# Patient Record
Sex: Female | Born: 1950
Health system: Southern US, Community
[De-identification: ages and names within clinical notes are randomized; demographics above are authoritative.]

## PROBLEM LIST (undated history)

## (undated) DIAGNOSIS — K449 Diaphragmatic hernia without obstruction or gangrene: Secondary | ICD-10-CM

## (undated) DIAGNOSIS — T7840XA Allergy, unspecified, initial encounter: Secondary | ICD-10-CM

## (undated) DIAGNOSIS — IMO0002 Reserved for concepts with insufficient information to code with codable children: Secondary | ICD-10-CM

## (undated) DIAGNOSIS — J45909 Unspecified asthma, uncomplicated: Secondary | ICD-10-CM

## (undated) DIAGNOSIS — D131 Benign neoplasm of stomach: Secondary | ICD-10-CM

## (undated) DIAGNOSIS — K2270 Barrett's esophagus without dysplasia: Secondary | ICD-10-CM

## (undated) DIAGNOSIS — C50919 Malignant neoplasm of unspecified site of unspecified female breast: Secondary | ICD-10-CM

## (undated) DIAGNOSIS — E785 Hyperlipidemia, unspecified: Secondary | ICD-10-CM

## (undated) DIAGNOSIS — K219 Gastro-esophageal reflux disease without esophagitis: Secondary | ICD-10-CM

## (undated) DIAGNOSIS — M1990 Unspecified osteoarthritis, unspecified site: Secondary | ICD-10-CM

## (undated) DIAGNOSIS — I10 Essential (primary) hypertension: Secondary | ICD-10-CM

## (undated) HISTORY — DX: Unspecified osteoarthritis, unspecified site: M19.90

## (undated) HISTORY — DX: Reserved for concepts with insufficient information to code with codable children: IMO0002

## (undated) HISTORY — DX: Barrett's esophagus without dysplasia: K22.70

## (undated) HISTORY — DX: Allergy, unspecified, initial encounter: T78.40XA

## (undated) HISTORY — PX: TOTAL ABDOMINAL HYSTERECTOMY: SHX209

## (undated) HISTORY — DX: Hyperlipidemia, unspecified: E78.5

## (undated) HISTORY — PX: EYE SURGERY: SHX253

## (undated) HISTORY — DX: Essential (primary) hypertension: I10

## (undated) HISTORY — DX: Benign neoplasm of stomach: D13.1

## (undated) HISTORY — PX: MASTECTOMY: SHX3

## (undated) HISTORY — DX: Malignant neoplasm of unspecified site of unspecified female breast: C50.919

## (undated) HISTORY — DX: Gastro-esophageal reflux disease without esophagitis: K21.9

## (undated) HISTORY — PX: COLONOSCOPY: SHX174

## (undated) HISTORY — PX: BREAST RECONSTRUCTION: SHX9

## (undated) HISTORY — DX: Unspecified asthma, uncomplicated: J45.909

## (undated) HISTORY — PX: ENDOMETRIAL BIOPSY: SHX622

## (undated) HISTORY — DX: Diaphragmatic hernia without obstruction or gangrene: K44.9

## (undated) HISTORY — PX: UPPER GASTROINTESTINAL ENDOSCOPY: SHX188

---

## 1998-08-30 ENCOUNTER — Other Ambulatory Visit: Admission: RE | Admit: 1998-08-30 | Discharge: 1998-08-30 | Payer: Self-pay | Admitting: *Deleted

## 1999-10-20 ENCOUNTER — Other Ambulatory Visit: Admission: RE | Admit: 1999-10-20 | Discharge: 1999-10-20 | Payer: Self-pay | Admitting: *Deleted

## 1999-11-01 ENCOUNTER — Ambulatory Visit (HOSPITAL_COMMUNITY): Admission: RE | Admit: 1999-11-01 | Discharge: 1999-11-01 | Payer: Self-pay | Admitting: *Deleted

## 1999-12-21 ENCOUNTER — Encounter: Admission: RE | Admit: 1999-12-21 | Discharge: 1999-12-21 | Payer: Self-pay | Admitting: *Deleted

## 2001-05-28 ENCOUNTER — Other Ambulatory Visit: Admission: RE | Admit: 2001-05-28 | Discharge: 2001-05-28 | Payer: Self-pay | Admitting: *Deleted

## 2001-07-01 ENCOUNTER — Encounter: Payer: Self-pay | Admitting: Internal Medicine

## 2001-07-01 ENCOUNTER — Encounter: Admission: RE | Admit: 2001-07-01 | Discharge: 2001-07-01 | Payer: Self-pay | Admitting: Internal Medicine

## 2001-11-10 ENCOUNTER — Encounter: Admission: RE | Admit: 2001-11-10 | Discharge: 2001-11-10 | Payer: Self-pay | Admitting: Internal Medicine

## 2003-02-22 ENCOUNTER — Ambulatory Visit (HOSPITAL_COMMUNITY): Admission: RE | Admit: 2003-02-22 | Discharge: 2003-02-22 | Payer: Self-pay | Admitting: *Deleted

## 2004-09-27 ENCOUNTER — Ambulatory Visit (HOSPITAL_COMMUNITY): Admission: RE | Admit: 2004-09-27 | Discharge: 2004-09-27 | Payer: Self-pay | Admitting: *Deleted

## 2010-06-05 ENCOUNTER — Other Ambulatory Visit: Payer: Self-pay | Admitting: Internal Medicine

## 2010-06-05 DIAGNOSIS — N63 Unspecified lump in unspecified breast: Secondary | ICD-10-CM

## 2010-06-08 ENCOUNTER — Other Ambulatory Visit: Payer: Self-pay | Admitting: Internal Medicine

## 2010-06-08 ENCOUNTER — Ambulatory Visit
Admission: RE | Admit: 2010-06-08 | Discharge: 2010-06-08 | Disposition: A | Discharge status: Home / Self Care | Payer: 59 | Source: Ambulatory Visit | Attending: Internal Medicine | Admitting: Internal Medicine

## 2010-06-08 ENCOUNTER — Other Ambulatory Visit: Payer: Self-pay | Admitting: Diagnostic Radiology

## 2010-06-08 DIAGNOSIS — N63 Unspecified lump in unspecified breast: Secondary | ICD-10-CM

## 2010-06-09 ENCOUNTER — Other Ambulatory Visit: Payer: Self-pay | Admitting: Internal Medicine

## 2010-06-09 DIAGNOSIS — C50912 Malignant neoplasm of unspecified site of left female breast: Secondary | ICD-10-CM

## 2010-06-13 ENCOUNTER — Ambulatory Visit
Admission: RE | Admit: 2010-06-13 | Discharge: 2010-06-13 | Disposition: A | Discharge status: Home / Self Care | Payer: 59 | Source: Ambulatory Visit | Attending: Internal Medicine | Admitting: Internal Medicine

## 2010-06-13 DIAGNOSIS — C50912 Malignant neoplasm of unspecified site of left female breast: Secondary | ICD-10-CM

## 2010-06-13 MED ORDER — GADOBENATE DIMEGLUMINE 529 MG/ML IV SOLN: 17.0000 mL | Freq: Once | INTRAVENOUS | Status: AC | PRN

## 2010-06-14 ENCOUNTER — Encounter (HOSPITAL_BASED_OUTPATIENT_CLINIC_OR_DEPARTMENT_OTHER): Payer: 59 | Admitting: Oncology

## 2010-06-14 ENCOUNTER — Other Ambulatory Visit: Payer: Self-pay | Admitting: Oncology

## 2010-06-14 DIAGNOSIS — C50519 Malignant neoplasm of lower-outer quadrant of unspecified female breast: Secondary | ICD-10-CM

## 2010-06-14 DIAGNOSIS — C50919 Malignant neoplasm of unspecified site of unspecified female breast: Secondary | ICD-10-CM

## 2010-06-14 LAB — COMPREHENSIVE METABOLIC PANEL
ALT: 23 U/L (ref 0–35)
CO2: 31 mEq/L (ref 19–32)
Calcium: 9.6 mg/dL (ref 8.4–10.5)
Chloride: 99 mEq/L (ref 96–112)
Creatinine, Ser: 0.92 mg/dL (ref 0.40–1.20)
Glucose, Bld: 171 mg/dL — ABNORMAL HIGH (ref 70–99)
Total Protein: 7.2 g/dL (ref 6.0–8.3)

## 2010-06-14 LAB — CBC WITH DIFFERENTIAL/PLATELET
BASO%: 0.5 % (ref 0.0–2.0)
HCT: 41.2 % (ref 34.8–46.6)
MCHC: 33.5 g/dL (ref 31.5–36.0)
MONO#: 0.5 10*3/uL (ref 0.1–0.9)
RBC: 4.76 10*6/uL (ref 3.70–5.45)
WBC: 8 10*3/uL (ref 3.9–10.3)
lymph#: 2.6 10*3/uL (ref 0.9–3.3)

## 2010-06-14 LAB — CANCER ANTIGEN 27.29: CA 27.29: 26 U/mL (ref 0–39)

## 2010-06-19 ENCOUNTER — Other Ambulatory Visit (HOSPITAL_COMMUNITY): Payer: Self-pay | Admitting: General Surgery

## 2010-06-19 ENCOUNTER — Encounter: Payer: 59 | Admitting: Genetic Counselor

## 2010-06-19 DIAGNOSIS — C50912 Malignant neoplasm of unspecified site of left female breast: Secondary | ICD-10-CM

## 2010-06-22 ENCOUNTER — Encounter (HOSPITAL_COMMUNITY): Payer: Self-pay

## 2010-06-22 ENCOUNTER — Encounter (HOSPITAL_COMMUNITY)
Admission: RE | Admit: 2010-06-22 | Discharge: 2010-06-22 | Disposition: A | Discharge status: Home / Self Care | Payer: 59 | Source: Ambulatory Visit | Attending: Oncology | Admitting: Oncology

## 2010-06-22 DIAGNOSIS — C50919 Malignant neoplasm of unspecified site of unspecified female breast: Secondary | ICD-10-CM | POA: Insufficient documentation

## 2010-06-22 LAB — GLUCOSE, CAPILLARY: Glucose-Capillary: 140 mg/dL — ABNORMAL HIGH (ref 70–99)

## 2010-06-22 MED ORDER — FLUDEOXYGLUCOSE F - 18 (FDG) INJECTION
18.4000 | Freq: Once | INTRAVENOUS | Status: AC | PRN
  Administered 2010-06-22: 18.4 via INTRAVENOUS

## 2010-07-24 ENCOUNTER — Other Ambulatory Visit: Payer: Self-pay | Admitting: Oncology

## 2010-07-24 ENCOUNTER — Encounter (HOSPITAL_BASED_OUTPATIENT_CLINIC_OR_DEPARTMENT_OTHER): Payer: 59 | Admitting: Oncology

## 2010-07-24 DIAGNOSIS — C50519 Malignant neoplasm of lower-outer quadrant of unspecified female breast: Secondary | ICD-10-CM

## 2010-07-24 DIAGNOSIS — C50919 Malignant neoplasm of unspecified site of unspecified female breast: Secondary | ICD-10-CM

## 2010-07-24 LAB — VITAMIN D 25 HYDROXY (VIT D DEFICIENCY, FRACTURES): Vit D, 25-Hydroxy: 24 ng/mL — ABNORMAL LOW (ref 30–89)

## 2010-07-25 ENCOUNTER — Ambulatory Visit (HOSPITAL_COMMUNITY)
Admission: RE | Admit: 2010-07-25 | Discharge: 2010-07-25 | Disposition: A | Discharge status: Home / Self Care | Payer: 59 | Source: Ambulatory Visit | Attending: General Surgery | Admitting: General Surgery

## 2010-07-25 ENCOUNTER — Encounter (HOSPITAL_COMMUNITY)
Admission: RE | Admit: 2010-07-25 | Discharge: 2010-07-25 | Disposition: A | Discharge status: Home / Self Care | Payer: 59 | Source: Ambulatory Visit | Attending: General Surgery | Admitting: General Surgery

## 2010-07-25 ENCOUNTER — Other Ambulatory Visit (INDEPENDENT_AMBULATORY_CARE_PROVIDER_SITE_OTHER): Payer: Self-pay | Admitting: General Surgery

## 2010-07-25 ENCOUNTER — Other Ambulatory Visit: Payer: Self-pay | Admitting: Obstetrics and Gynecology

## 2010-07-25 DIAGNOSIS — C50919 Malignant neoplasm of unspecified site of unspecified female breast: Secondary | ICD-10-CM | POA: Insufficient documentation

## 2010-07-25 DIAGNOSIS — C50912 Malignant neoplasm of unspecified site of left female breast: Secondary | ICD-10-CM

## 2010-07-25 DIAGNOSIS — Z01818 Encounter for other preprocedural examination: Secondary | ICD-10-CM | POA: Insufficient documentation

## 2010-07-25 DIAGNOSIS — J45909 Unspecified asthma, uncomplicated: Secondary | ICD-10-CM | POA: Insufficient documentation

## 2010-07-25 DIAGNOSIS — Z01812 Encounter for preprocedural laboratory examination: Secondary | ICD-10-CM | POA: Insufficient documentation

## 2010-07-25 DIAGNOSIS — I10 Essential (primary) hypertension: Secondary | ICD-10-CM | POA: Insufficient documentation

## 2010-07-25 LAB — BASIC METABOLIC PANEL
Calcium: 10.6 mg/dL — ABNORMAL HIGH (ref 8.4–10.5)
GFR calc Af Amer: >60 mL/min (ref >60)
GFR calc non Af Amer: >60 mL/min (ref >60)
Glucose, Bld: 102 mg/dL — ABNORMAL HIGH (ref 70–99)
Sodium: 139 mEq/L (ref 135–145)

## 2010-07-25 LAB — SURGICAL PCR SCREEN: MRSA, PCR: NEGATIVE

## 2010-07-25 LAB — CBC
Platelets: 370 10*3/uL (ref 150–400)
RDW: 13.7 % (ref 11.5–15.5)
WBC: 7.3 10*3/uL (ref 4.0–10.5)

## 2010-07-28 ENCOUNTER — Other Ambulatory Visit (INDEPENDENT_AMBULATORY_CARE_PROVIDER_SITE_OTHER): Payer: Self-pay | Admitting: General Surgery

## 2010-07-28 ENCOUNTER — Inpatient Hospital Stay (HOSPITAL_COMMUNITY)
Admission: RE | Admit: 2010-07-28 | Discharge: 2010-07-30 | DRG: 581 | Disposition: A | Discharge status: Home / Self Care | Payer: 59 | Source: Ambulatory Visit | Attending: General Surgery | Admitting: General Surgery

## 2010-07-28 ENCOUNTER — Ambulatory Visit (HOSPITAL_COMMUNITY)
Admission: RE | Admit: 2010-07-28 | Discharge: 2010-07-28 | Disposition: A | Discharge status: Home / Self Care | Payer: 59 | Source: Ambulatory Visit | Attending: General Surgery | Admitting: General Surgery

## 2010-07-28 DIAGNOSIS — Z880 Allergy status to penicillin: Secondary | ICD-10-CM

## 2010-07-28 DIAGNOSIS — C50912 Malignant neoplasm of unspecified site of left female breast: Secondary | ICD-10-CM

## 2010-07-28 DIAGNOSIS — Z853 Personal history of malignant neoplasm of breast: Secondary | ICD-10-CM

## 2010-07-28 DIAGNOSIS — C50919 Malignant neoplasm of unspecified site of unspecified female breast: Principal | ICD-10-CM | POA: Diagnosis present

## 2010-07-28 MED ORDER — TECHNETIUM TC 99M SULFUR COLLOID FILTERED
1.0000 | Freq: Once | INTRAVENOUS | Status: AC | PRN
  Administered 2010-07-28: 1 via INTRADERMAL

## 2010-08-02 NOTE — Discharge Summary (Signed)
  [name], [name]            ACCOUNT NO.:  [id number]  MEDICAL RECORD NO.:  [id number]           PATIENT TYPE:  I  LOCATION:  5157                         FACILITY:  [facility]  PHYSICIAN:  Etter Sjogren, M.D.     DATE OF BIRTH:  07-20-1950  DATE OF ADMISSION:  07/28/2010 DATE OF DISCHARGE:  07/30/2010                              DISCHARGE SUMMARY   FINAL DIAGNOSIS:  Left breast cancer.  PROCEDURES: 1. Left total mastectomy. 2. Left sentinel lymph node mapping. 3. Left breast reconstruction with tissue expander.  HISTORY OF PRESENT ILLNESS:  A 60 year old woman had breast cancer on the right and has had mastectomy and reconstruction with implant and now has breast cancer on the left and presented for reconstruction.  She was planned to have mastectomy as well.  Procedure risks discussed with her and she understood those and wished to proceed.  She did elect at the same type of reconstruction on the right side using tissue expander followed eventually by placement of implant.  She understood the plan and nature of these procedures.  Further details of history and physical, please see chart.  COURSE IN THE HOSPITAL:  On admission, she was taken to Surgery at which time the mastectomy, sentinel lymph node mapping, and reconstruction tissue expander were performed.  She tolerated that procedure well and postoperatively she did well.  She remained afebrile.  Skin flaps looked very good.  No distinct fashion to compromise.  No evidence any hematoma or bleeding.  No evidence of infections, drains were functioning, thin drainage, and she was tolerating a diet on first postoperative day.  On the second postoperative day it was felt that she should be discharged.  DISPOSITION:  She is discharged on regular diet.  No lifting or vigorous activities.  No raise in her arm overhead.  Drains three times and record the amounts and call the office in few days and report the amount of  drainage.  Incentive spirometry 8-10 times a day at home.  No shower and we will see her back in the office in about a week or sooner if the drains develop minimal drainage.  DISCHARGE MEDICATIONS: 1. Dilaudid 2-4 mg p.o. q.4 for pain. 2. Robaxin 500 mg 1 p.o. q.12 p.r.n. muscle spasm. 3. Cleocin 300 mg 1 p.o. q.8 for about 3 days and then twice a day     until the drains have been removed.  CONDITION ON DISCHARGE:  Good.  FINAL DIAGNOSIS:  Breast cancer.     Etter Sjogren, M.D.     DB/MEDQ  D:  07/30/2010  T:  07/30/2010  Job:  [id number]  Electronically Signed by Etter Sjogren M.D. on 08/02/2010 07:50:26 AM

## 2010-08-02 NOTE — Op Note (Signed)
[name], [name]            ACCOUNT NO.:  [id number]  MEDICAL RECORD NO.:  [id number]           PATIENT TYPE:  I  LOCATION:  5157                         FACILITY:  [facility]  PHYSICIAN:  Etter Sjogren, M.D.     DATE OF BIRTH:  09/20/1950  DATE OF PROCEDURE:  07/28/2010 DATE OF DISCHARGE:                              OPERATIVE REPORT   PREOPERATIVE DIAGNOSIS:  Left breast cancer.  POSTOPERATIVE DIAGNOSIS:  Left breast cancer.  PROCEDURE PERFORMED:  Left breast reconstruction with tissue expander.  SURGEON:  Etter Sjogren, MD  ANESTHESIA:  General.  ESTIMATED BLOOD LOSS:  10 mL.  DRAINS:  Two 19-French.  CLINICAL NOTE:  This is a 60 year old woman who has left breast cancer and is undergoing mastectomy.  She did desire reconstruction.  Options were discussed.  She had had a previous right breast cancer and had a reconstruction with tissue expander implant.  She desired that same reconstruction for the left side.  Procedure risks plus complications were discussed with her in great detail.  She understood those risks to include but not limited to bleeding, infection, anesthesia complications, healing problems, scarring, fluid accumulations, loss of sensation, displacement of device, capsular contracture, wrinkles, ripples, disappointment, asymmetry, pulmonary embolism, pneumothorax, and she understood all this and wished to proceed.  DESCRIPTION OF PROCEDURE:  The patient was in the operating room and the mastectomy had been completed.  The dissection was carried deep to the pectoralis major muscle after first irrigating thoroughly with saline antibiotic solution.  The serratus muscle also lifted laterally for a very short distance and care was taken to avoid damage to the underlying chest cavity.  The submuscular space was made and again irrigation with saline irrigation and antibiotic solution, excellent hemostasis was confirmed.  After thoroughly cleaning the gloves,  the expander was prepared.  This was a Mentor 550 mL moderate height expander and 100 mL sterile saline placed using a closed filling system.  The expander was soaked in antibiotic solution for greater than 5 minutes.  A check was again made for hemostasis and noted to be excellent.  The expander was positioned underneath the muscle and 3-0 Vicryl sutures were placed with one of the suture tabs to avoid rotation and the muscle closed over the expander using 3-0 Vicryl interrupted figure-of-eight sutures. Submuscular coverage was achieved.  The 19-French Blakes were then brought through separate stab wounds inferolaterally and secured with 3- 0 Prolene sutures, one of this was placed towards the axilla and then under the superior breast flap and the other under the inferior breast flap.  Again, antibiotic solution was placed and the skin flaps were inspected and noted to be in excellent color and noticed a vascular compromise.  Additional 100 mL sterile saline was placed using a closed filling system, so the total fill on the expander was 200 mL.  The skin closure with 3-0 Monocryl interrupted inverted deep dermal sutures and Dermabond and antibiotic ointment with dry sterile dressing applied around the drains and then dry sterile dressing over the mastectomy site and the chest binder.  She was transferred to the recovery room stable having tolerated the procedure  well.     Etter Sjogren, M.D.     DB/MEDQ  D:  07/28/2010  T:  07/29/2010  Job:  [id number]  Electronically Signed by Etter Sjogren M.D. on 08/02/2010 07:50:19 AM

## 2010-08-04 NOTE — Op Note (Signed)
[name], [name]            ACCOUNT NO.:  [id number]  MEDICAL RECORD NO.:  [id number]           PATIENT TYPE:  I  LOCATION:  5157                         FACILITY:  [facility]  PHYSICIAN:  Ollen Gross. Vernell Morgans, M.D. DATE OF BIRTH:  [birth year]  DATE OF PROCEDURE:  07/28/2010 DATE OF DISCHARGE:                              OPERATIVE REPORT   PREOPERATIVE DIAGNOSIS:  Locally advanced left breast cancer.  POSTOPERATIVE DIAGNOSIS:  Locally advanced left breast cancer.  PROCEDURE:  Left mastectomy with sentinel node biopsy x2 and injection of blue dye.  SURGEON:  Ollen Gross. Vernell Morgans, MD  ASSISTANT:  Velora Heckler, MD  ANESTHESIA:  General endotracheal.  PROCEDURE:  After informed consent was obtained, the patient was brought to the operating room and placed in supine position on the operating room table.  After adequate induction of general anesthesia, the patient's chest, breast and axillary areas were prepped with ChloraPrepand allowed to dry and then draped in usual sterile manner.  Earlier in the day, the patient had undergone injection of 1 mCi of technetium sulfur colloid in the subareolar position on the left breast.  At this point, 2 mL of methylene blue and 3 mL of injectable saline were also injected in the subareolar position of the left breast.  The breast was massaged for several minutes.  We then identified a hot spot in the left axilla and marked it on the skin with a purple marker.  We then mapped out an elliptical incision around the nipple-areolar complex in order to try to minimize how much skin was removed to keep the incision symmetric to the previous incision she had had on the right breast.  The incision was then made along the mapped outlines with a 10 blade knife.  This incision was carried down through the skin and subcutaneous tissue sharply with the electrocautery.  Skin hooks were then used to elevate the skin flaps anteriorly and thin skin flaps were  created using the harmonic scalpel, blade as well as the Bovie electrocautery.  The dissection was carried circumferentially around the incision all the way down to the chest wall.  Laterally, the dissection was carried down to the latissimus muscle again in the plane between the breast tissue and the subcutaneous fat.  Once we had accomplished that, we were able to identify the hot spot in the left axilla from within the mastectomy incision.  Using a combination of blunt tonsil dissection and some sharp dissection with the electrocautery, we were able to identify a hot blue lymph node.  We excised this sharply with the electrocautery.  Ex vivo counts on this node were approximately 2000.  This was sent as sentinel node #1.  We then removed the breast from the chest wall pectoralis muscle with the pectoralis fascia.  This was done sharply with the electrocautery and done from the top down laterally and the breast was removed from the chest wall in a similar fashion.  Once this was accomplished, the left breast was completely removed from the patient. We checked it with the Neoprobe and we did identify hot spot sort of in the tail  of the breast.  We dissected in this area sharply with Metzenbaum scissors and did identify another sentinel node.  Ex vivo counts on this node were around 1000.  This was sent as sentinel node #2.  No other blue or palpable lymph nodes were identified.  There was some radioactivity very high in the axilla but it appeared to be up around the axillary vein and safely tried to go after.  The counts on this were borderline as well.  So at this point the dissection was complete.  The breast was removed, it was marked with a stitch on the lateral aspect and sent to pathology.  Hemostasis was achieved using the Bovie electrocautery.  The wound was irrigated with copious amounts of saline and then packed with moist gauze.  At this point, the patient was doing very well.   The case was then turned over to Dr. Odis Luster for the reconstruction portion.  His portion of the case will be dictated separately.     Ollen Gross. Vernell Morgans, M.D.     PST/MEDQ  D:  07/28/2010  T:  07/29/2010  Job:  [id number]  Electronically Signed by Chevis Pretty III M.D. on 08/04/2010 09:13:21 AM

## 2010-08-21 ENCOUNTER — Other Ambulatory Visit (HOSPITAL_COMMUNITY): Payer: 59

## 2010-08-21 ENCOUNTER — Ambulatory Visit (HOSPITAL_COMMUNITY): Admission: RE | Admit: 2010-08-21 | Payer: 59 | Source: Ambulatory Visit | Admitting: General Surgery

## 2010-08-23 ENCOUNTER — Ambulatory Visit: Payer: 59 | Attending: Gynecologic Oncology | Admitting: Gynecologic Oncology

## 2010-08-23 DIAGNOSIS — C549 Malignant neoplasm of corpus uteri, unspecified: Secondary | ICD-10-CM | POA: Insufficient documentation

## 2010-08-23 DIAGNOSIS — C50419 Malignant neoplasm of upper-outer quadrant of unspecified female breast: Secondary | ICD-10-CM | POA: Insufficient documentation

## 2010-08-23 DIAGNOSIS — Z79899 Other long term (current) drug therapy: Secondary | ICD-10-CM | POA: Insufficient documentation

## 2010-08-23 DIAGNOSIS — K219 Gastro-esophageal reflux disease without esophagitis: Secondary | ICD-10-CM | POA: Insufficient documentation

## 2010-08-23 DIAGNOSIS — Z803 Family history of malignant neoplasm of breast: Secondary | ICD-10-CM | POA: Insufficient documentation

## 2010-08-23 DIAGNOSIS — Z9010 Acquired absence of unspecified breast and nipple: Secondary | ICD-10-CM | POA: Insufficient documentation

## 2010-08-23 DIAGNOSIS — I10 Essential (primary) hypertension: Secondary | ICD-10-CM | POA: Insufficient documentation

## 2010-08-24 NOTE — Consult Note (Signed)
[name], [name]            ACCOUNT NO.:  [id number]  MEDICAL RECORD NO.:  [id number]  LOCATION:  GYN                          FACILITY:  [facility]  PHYSICIAN:  Whitney Marte A. Duard Brady, MD    DATE OF BIRTH:  04/17/1950  DATE OF CONSULTATION:  08/23/2010 DATE OF DISCHARGE:                                CONSULTATION   REFERRING PHYSICIAN:  Kendra H. Tenny Craw, MD  HISTORY OF PRESENT ILLNESS:  [name] is a 60 year old gravida 0 who had a diagnosis of breast cancer in 69 at Whitney age of 37.  At that time, [name] underwent a mastectomy and [name] received postoperative chemotherapy for 6 months, for which [name] developed alopecia.  [name] had not been having regularly scheduled mammograms and [name] last documented mammogram had been in 2003.  [name] saw Dr. Ricki Leonard who referred [name] for mammogram in Whitney left breast in April 2012 and a spiculated 3 cm mass at Whitney 4 o'clock position was appreciated with a 1.5 cm mass at Whitney 1 o'clock position as well as an enlarged 3 cm left axillary node. Biopsies on that day revealed invasive ductal carcinoma, ER positive, PR positive, 30%, proliferative index 80%, HER2 negative.  MRI showed Whitney left upper outer quadrant mass measuring 3 x 2.6 x 3.2 cm with a cluster of Whitney regular spiculation.  [name] also underwent a PET scan that showed uptake in Whitney left breast mass compatible with [name] biopsy-proven invasive ductal carcinoma.  There was no avid metastases in Whitney neck, chest, or abdomen.  However, there was an abnormal uptake in Whitney uterine fundus, endometrial cavity with a differential of endometrial carcinoma, hyperplasia, or polyps.  [name] was referred to Dr. Waynard Leonard for endometrial biopsy, which was done and it revealed a grade 1 endometrioid adenocarcinoma, which is why [name] is ultimately referred to [place] today.  This biopsy was done on May 22.  On May 25th, [name] underwent a left-sided mastectomy with reconstruction.  [name] states that Whitney margins were negative, Whitney  nodes were negative.  [name] is not sure what postoperative therapy [name] will have, [name] will be seeing Dr. Donnie Leonard on June 29th.  [name] states that with regard to menopause, [name] became menopausal at Whitney age of 60 after receiving [name] chemotherapy, but then [name] bleeding started again and [name] has been spotting ever since and is really never stopped having spotting and has been very long-standing, is not associated with any pain or discomfort.  [name] denies any change in bowel or bladder habits.  [name] does occasionally have some constipation, which [name] attributes to Whitney pain medication that [name] is on for [name] mastectomy. [name] is taking Tylenol for this.  [name] otherwise is doing quite well.  PAST SURGICAL HISTORY:  May 25th, left-sided mastectomy and sentinel lymph node by Dr. Janalyn Leonard, history of right mastectomy 61 years ago, eye surgery at age of 60.  MEDICATIONS: 1. Lisinopril. 2. Hydrochlorothiazide. 3. Nexium 20 mg daily.  [name] is not sure of that dose of [name]     antihypertensives.  ALLERGIES:  PENICILLIN, [name] is not sure of Whitney reaction.  PAST MEDICAL HISTORY: 1. Hypertension. 2. Gastroesophageal reflux disease. 3. Barrett esophagitis. 4. Breast cancer. 5.  Endometrial cancer.  SOCIAL HISTORY:  [name] denies use of tobacco or alcohol.  [name] is married. [name] works at [employer].  FAMILY HISTORY:  [name] mother had diabetes, hypertension, and breast cancer at Whitney age of 58.  Maternal aunt had breast cancer at 62 and is now 47.  Three maternal uncles all had lung cancer.  Maternal grandmother passed away in [name] 34s and may have had cancer.  Father passed away from a motor vehicle accident at Whitney age of 19 and was an only child.  Paternal grandmother had breast cancer at 27 and passed away at 58.  [name] did have BRCA1 and BRCA2 testing, which was negative.  PHYSICAL EXAMINATION:  VITAL SIGNS:  Weight 182 pounds, height 5 feet 4.5 inches, a BMI of 32.  Blood pressure 120/70, temperature 98, respirations 18, pulse  76. GENERAL:  A well-nourished, well-developed female in no acute distress. NECK:  Supple.  There is no lymphadenopathy, no thyromegaly. LUNGS:  Clear to auscultation bilaterally. CARDIOVASCULAR:  Regular rate and rhythm.  Left breast surgical site was not inspected. ABDOMEN:  Obese, soft, nontender, nondistended.  There are no palpable masses or hepatosplenomegaly.  Groins are negative for adenopathy. EXTREMITIES:  There is no edema. PELVIC:  External genitalia is within normal limits.  Vagina is slightly atrophic.  Whitney cervix is visualized, it is nulliparous.  There are no visible lesions.  Bimanual examination reveals a narrow arch, corpus of normal size, shape, consistency.  There are no adnexal masses. RECTAL:  Confirms.  ASSESSMENT:  A 60 year old with 2 primaries of breast cancer, most recently was diagnosed with a breast cancer and underwent a left-sided mastectomy on May 25th who also has concomitant grade 1 endometrioid adenocarcinoma.  I discussed with [name] and [name] husband that there are three ways of treating endometrial cancer, One is hormonal manipulation, Whitney other is surgery, Whitney other is radiation.  For [name], I would recommend surgical approach and I think we can accomplish this robotically.  [name] does appear to be recovering fairly well from this cancer.  [name] does have an appointment with Dr. Donnie Leonard on June 26th to follow up in terms of additional procedures were therapy.  [name] would like to proceed with a robotic-assisted hysterectomy.  [name] knows that Whitney uterus will be sent for frozen section and we will determine Whitney need for pelvic and paraortic lymphadenectomy based on Whitney results of Whitney frozen section.  In thinking about [name] BRCA testing, we will proceed also with Whitney removal of Whitney tubes and ovaries, so that if there was any chance of a lesser known BRCA mutation that that would be covered with removal of Whitney tubes and ovaries.  However, with [name] having  an endometrial carcinoma hereditary nonpolyposis colorectal carcinoma picture does arise; however, there was no colon cancers in family and I did not believe [name] most likely require any an additional genetic testing.  Risks and benefits of surgery including bleeding, infection, injury to surrounding organs, lymphedema, and conversion to a laparotomy were discussed with [name] and [name] wished to proceed.  We tentatively have [name] scheduled for surgery on July 10th that [name] knows that that can be altered pending [name] consultation with Dr. Donnie Leonard.  [name] has my card and will contact me if [name] has any questions.     Daryll Spisak A. Duard Brady, MD     PAG/MEDQ  D:  08/23/2010  T:  08/24/2010  Job:  [id number]  cc:   Pierce Crane, M.D., F.R.C.P.C. Fax: [phone]  Freddrick March. Tenny Craw, MD Fax: [phone]  Telford Nab, R.N. [address] [place], [place] [address]  Angeline Slim, MD  Juline Patch, M.D. Fax: [phone]  Electronically Signed by Cleda Mccreedy MD on 08/24/2010 02:51:22 PM

## 2010-09-01 ENCOUNTER — Encounter (HOSPITAL_BASED_OUTPATIENT_CLINIC_OR_DEPARTMENT_OTHER): Payer: 59 | Admitting: Oncology

## 2010-09-01 DIAGNOSIS — C50919 Malignant neoplasm of unspecified site of unspecified female breast: Secondary | ICD-10-CM

## 2010-09-08 ENCOUNTER — Encounter (HOSPITAL_COMMUNITY): Payer: 59

## 2010-09-08 ENCOUNTER — Other Ambulatory Visit: Payer: Self-pay | Admitting: Gynecologic Oncology

## 2010-09-08 LAB — DIFFERENTIAL
Basophils Relative: 0 % (ref 0–1)
Eosinophils Absolute: 0.2 10*3/uL (ref 0.0–0.7)
Monocytes Relative: 7 % (ref 3–12)
Neutrophils Relative %: 61 % (ref 43–77)

## 2010-09-08 LAB — COMPREHENSIVE METABOLIC PANEL
ALT: 34 U/L (ref 0–35)
AST: 22 U/L (ref 0–37)
CO2: 32 mEq/L (ref 19–32)
Calcium: 9.9 mg/dL (ref 8.4–10.5)
Creatinine, Ser: 0.83 mg/dL (ref 0.50–1.10)
GFR calc non Af Amer: >60 mL/min (ref >60)
Sodium: 139 mEq/L (ref 135–145)
Total Protein: 8 g/dL (ref 6.0–8.3)

## 2010-09-08 LAB — CBC
MCH: 28.1 pg (ref 26.0–34.0)
Platelets: 358 10*3/uL (ref 150–400)
RBC: 4.62 MIL/uL (ref 3.87–5.11)
RDW: 13.6 % (ref 11.5–15.5)

## 2010-09-08 LAB — SURGICAL PCR SCREEN
MRSA, PCR: NEGATIVE
Staphylococcus aureus: NEGATIVE

## 2010-09-12 ENCOUNTER — Ambulatory Visit (HOSPITAL_COMMUNITY)
Admission: RE | Admit: 2010-09-12 | Discharge: 2010-09-14 | Disposition: A | Discharge status: Home / Self Care | Payer: 59 | Source: Ambulatory Visit | Attending: Obstetrics & Gynecology | Admitting: Obstetrics & Gynecology

## 2010-09-12 ENCOUNTER — Other Ambulatory Visit: Payer: Self-pay | Admitting: Gynecologic Oncology

## 2010-09-12 DIAGNOSIS — Z79899 Other long term (current) drug therapy: Secondary | ICD-10-CM | POA: Insufficient documentation

## 2010-09-12 DIAGNOSIS — C55 Malignant neoplasm of uterus, part unspecified: Secondary | ICD-10-CM | POA: Insufficient documentation

## 2010-09-12 DIAGNOSIS — Z01812 Encounter for preprocedural laboratory examination: Secondary | ICD-10-CM | POA: Insufficient documentation

## 2010-09-12 DIAGNOSIS — K219 Gastro-esophageal reflux disease without esophagitis: Secondary | ICD-10-CM | POA: Insufficient documentation

## 2010-09-12 DIAGNOSIS — I10 Essential (primary) hypertension: Secondary | ICD-10-CM | POA: Insufficient documentation

## 2010-09-12 DIAGNOSIS — J45909 Unspecified asthma, uncomplicated: Secondary | ICD-10-CM | POA: Insufficient documentation

## 2010-09-12 DIAGNOSIS — Z853 Personal history of malignant neoplasm of breast: Secondary | ICD-10-CM | POA: Insufficient documentation

## 2010-09-12 LAB — TYPE AND SCREEN
ABO/RH(D): A POS
Antibody Screen: NEGATIVE

## 2010-09-12 LAB — SAMPLE TO BLOOD BANK

## 2010-09-12 LAB — ABO/RH: ABO/RH(D): A POS

## 2010-09-13 LAB — CBC
HCT: 37 % (ref 36.0–46.0)
MCHC: 31.9 g/dL (ref 30.0–36.0)
MCV: 86.4 fL (ref 78.0–100.0)
Platelets: 332 10*3/uL (ref 150–400)
RDW: 13.7 % (ref 11.5–15.5)

## 2010-09-14 ENCOUNTER — Encounter (INDEPENDENT_AMBULATORY_CARE_PROVIDER_SITE_OTHER): Payer: Self-pay | Admitting: General Surgery

## 2010-09-18 NOTE — Op Note (Signed)
[name], [name]            ACCOUNT NO.:  [id number]  MEDICAL RECORD NO.:  [id number]  LOCATION:  DAYL                         FACILITY:  [facility]  PHYSICIAN:  Laxmi Choung A. Duard Brady, MD    DATE OF BIRTH:  [birth year]  DATE OF PROCEDURE:  09/12/2010 DATE OF DISCHARGE:                              OPERATIVE REPORT   PREOPERATIVE DIAGNOSIS:  Grade 1 endometrioid adenocarcinoma.  POSTOPERATIVE DIAGNOSIS:  Grade 1 endometrioid adenocarcinoma.  PROCEDURE:  Total robotic hysterectomy, bilateral salpingo-oophorectomy, right pelvic lymph node dissection.  SURGEONS:  Micah Galeno A. Duard Brady, MD and Roseanna Rainbow, M.D.  ASSISTANT:  Aundria Rud  ANESTHESIA:  General.  ESTIMATED BLOOD LOSS:  Less than 50 mL.  IV FLUIDS:  1000 mL.  URINE OUTPUT:  100 mL.  COMPLICATIONS:  None.  SPECIMENS:  Included cervix, uterus, bilateral tubes and ovaries, right pelvic lymph nodes for pathology.  OPERATIVE FINDINGS:  Included a normal sized uterus.  Markedly redundant rectosigmoid colon with physiologic adhesions to the left pelvic sidewall and left adnexa.  Frozen section with a grade 1 endometrioid adenocarcinoma with adenomyosis.  No definitive depth of myometrial invasion.  Otherwise normal anatomic survey.  DESCRIPTION OF PROCEDURE:  The patient was identified in the preoperative holding area as self.  Informed consent was signed on the chart.  Risks and benefits of the procedure were discussed with the patient, her questions were elicited and answered to her satisfaction and she wished to proceed.  The patient was then taken to the operating room, placed in supine position with her arms tucked at her sides while awake to ensure maximum comfort and safety.  After arms were tucked, general anesthesia was induced and an OG tube was placed to suction. She was then placed in dorsal lithotomy position with appropriate precautions and SCDs.  Time-out was performed to confirm the patient, the  procedure, antibiotic and allergy status.  The abdomen was then prepped in the usual fashion.  After time-out was performed, the perineum and vagina were prepped.  Foley catheter was inserted in the bladder under sterile conditions.  Sterile speculum was placed in the vagina.  The cervix was grasped with single-tooth tenaculum.  It was dilated without difficulty and a ZUMI with a small KOH ring was applied. After the patient's prep was then dried, she was then draped.  Second time-out was performed to confirm the patient, the procedure, antibiotic, allergy status as well as staff.  A 2 cm below the left infracostal margin and midclavicular line, the skin was anesthetized with 0.25% Marcaine.  A 5-mm incision was made with the knife.  Using the 5-mm Optiview, direct intra-abdominal placement was secured.  The abdomen was insufflated with CO2 gas.  At this point and all points during the case, the patient's intra-abdominal pressure did not increase over 50 mmHg.  After insufflation, the patient was then placed in deep Trendelenburg position.  The 24 cm above the pubic symphysis, the area was marked for the robotic camera port.  10 cm and then at 15-degree angle, 8 mm bilateral ports were identified.  Each of the skin area was anesthetized with 0.25% Marcaine and the incisions were made.  Under direct visualization, the 10/12 camera port  was placed in the supraumbilical region.  The 8-mm robotic trocars were placed laterally. The small bowel was then folded on its mesentery.  The robot was then docked.  The round ligament on the patient's right side was transected using monopolar cautery.  The anterior and posterior leaves of the broad ligament were opened.  The pararectal space was opened and the ureter was identified.  A window was made between the IP and the ureter.  The IP was coagulated and transected.  The assistant grasped the colon to remove it from the deep cul-de-sac.  Posteriorly  the uterine vessels were skeletonized.  Anteriorly the bladder flap was carried.  Once the uterine vessels were skeletonized, they were cauterized with bipolar cautery and transected and C-loop was performed.  On the patient's left side, similarly the posterior leaf of broad ligament was opened and the adhesive disease of the rectosigmoid colon to the ovary was taken down. The round ligament was then transected and the bladder flap was completed.  The uterine vessels were skeletonized, coagulated and transected.  The pneumo-occluder balloon was then insufflated and the colpotomy was performed.  The uterus, cervix, tubes and ovaries were delivered through the vagina without difficulty and the pneumo-occluder balloon was replaced.  The uterus was sent for frozen section evaluation.  During this process, the paravesical space on the right side was opened.  The obturator nerve was identified.  Similarly on the patient's left side, the paravesical and pararectal spaces were opened.  The obturator nerve was identified. The nodal bundle extending over the right common iliac on the right side from the level of the bifurcation to the circumflex iliac vein was taken down using sharp dissection.  We then proceeded medially to remove the nodal bundle off the external iliac vein.  The paravesical space was opened with the assistance grasper.  The nodal bundle superior to the obturator nerve was taken without difficulty.  The ureter was noted to be well medially, obturator nerve inferior and genitofemoral nerve was spared.  The nodal bundle was placed in EndoCatch bag and delivered through the assistant 10/12 port.  At this point, frozen section returned as no myometrial invasion, adenomyosis, grade 1 tumor, and therefore the lymphadenectomy was discontinued.  At this point, instruments were changed.  The vaginal cuff was closed using a running suture of 0 Vicryl on a CT-1.  The abdomen and pelvis  were copiously irrigated and noted to be hemostatic.  The robotic trocar instruments were then removed and robot was undocked and the trocars removed from the patient's abdomen.  The CO2 was removed from the patient's abdomen. The 10/12 ports had deep subcu sutures placed with 0 Vicryl on a UR-6.  The skin was closed using 4-0 Vicryl.  Steri-Strips and Benzoin were placed.  The vagina was swabbed after moving the pneumo-occluder balloon and was noted to be hemostatic.  The patient tolerated the procedure well, was taken to recovery room in stable condition.  All instrument, needle, Ray-Tec counts were correct x2.     Miliani Deike A. Duard Brady, MD     PAG/MEDQ  D:  09/12/2010  T:  09/12/2010  Job:  [id number]  cc:   Pierce Crane, M.D., F.R.C.P.C. Fax: [phone]  Freddrick March. Tenny Craw, MD Fax: [phone]  Telford Nab, R.N. [address] [place], [place] [address]  Juline Patch, M.D. Fax: [phone]  Electronically Signed by Cleda Mccreedy MD on 09/18/2010 12:46:48 PM

## 2010-10-02 ENCOUNTER — Encounter (HOSPITAL_BASED_OUTPATIENT_CLINIC_OR_DEPARTMENT_OTHER): Payer: 59 | Admitting: Oncology

## 2010-10-02 DIAGNOSIS — C50919 Malignant neoplasm of unspecified site of unspecified female breast: Secondary | ICD-10-CM

## 2010-10-02 DIAGNOSIS — C50519 Malignant neoplasm of lower-outer quadrant of unspecified female breast: Secondary | ICD-10-CM

## 2010-10-13 ENCOUNTER — Other Ambulatory Visit: Payer: Self-pay | Admitting: Oncology

## 2010-10-13 DIAGNOSIS — C50919 Malignant neoplasm of unspecified site of unspecified female breast: Secondary | ICD-10-CM

## 2010-10-17 ENCOUNTER — Other Ambulatory Visit: Payer: Self-pay | Admitting: Oncology

## 2010-10-17 ENCOUNTER — Encounter (HOSPITAL_BASED_OUTPATIENT_CLINIC_OR_DEPARTMENT_OTHER): Payer: 59 | Admitting: Oncology

## 2010-10-17 DIAGNOSIS — C50219 Malignant neoplasm of upper-inner quadrant of unspecified female breast: Secondary | ICD-10-CM

## 2010-10-17 DIAGNOSIS — C50519 Malignant neoplasm of lower-outer quadrant of unspecified female breast: Secondary | ICD-10-CM

## 2010-10-17 DIAGNOSIS — Z5111 Encounter for antineoplastic chemotherapy: Secondary | ICD-10-CM

## 2010-10-17 DIAGNOSIS — C50919 Malignant neoplasm of unspecified site of unspecified female breast: Secondary | ICD-10-CM

## 2010-10-17 LAB — COMPREHENSIVE METABOLIC PANEL
ALT: 20 U/L (ref 0–35)
AST: 22 U/L (ref 0–37)
Alkaline Phosphatase: 88 U/L (ref 39–117)
CO2: 26 mEq/L (ref 19–32)
Creatinine, Ser: 0.99 mg/dL (ref 0.50–1.10)
Sodium: 139 mEq/L (ref 135–145)
Total Bilirubin: 0.3 mg/dL (ref 0.3–1.2)
Total Protein: 7.4 g/dL (ref 6.0–8.3)

## 2010-10-17 LAB — CBC WITH DIFFERENTIAL/PLATELET
BASO%: 0.6 % (ref 0.0–2.0)
Eosinophils Absolute: 0.2 10*3/uL (ref 0.0–0.5)
LYMPH%: 28.7 % (ref 14.0–49.7)
MCHC: 32.5 g/dL (ref 31.5–36.0)
MCV: 85.6 fL (ref 79.5–101.0)
MONO%: 7.5 % (ref 0.0–14.0)
NEUT#: 4.3 10*3/uL (ref 1.5–6.5)
RBC: 4.71 10*6/uL (ref 3.70–5.45)
RDW: 13.5 % (ref 11.2–14.5)
WBC: 7.1 10*3/uL (ref 3.9–10.3)
nRBC: 0 % (ref 0–0)

## 2010-10-18 ENCOUNTER — Encounter (HOSPITAL_BASED_OUTPATIENT_CLINIC_OR_DEPARTMENT_OTHER): Payer: 59 | Admitting: Oncology

## 2010-10-18 DIAGNOSIS — Z5189 Encounter for other specified aftercare: Secondary | ICD-10-CM

## 2010-10-19 ENCOUNTER — Ambulatory Visit (HOSPITAL_COMMUNITY)
Admission: RE | Admit: 2010-10-19 | Discharge: 2010-10-19 | Disposition: A | Discharge status: Home / Self Care | Payer: 59 | Source: Ambulatory Visit | Attending: Oncology | Admitting: Oncology

## 2010-10-19 ENCOUNTER — Other Ambulatory Visit: Payer: Self-pay | Admitting: Oncology

## 2010-10-19 DIAGNOSIS — C50919 Malignant neoplasm of unspecified site of unspecified female breast: Secondary | ICD-10-CM

## 2010-10-19 LAB — CBC
HCT: 39.6 % (ref 36.0–46.0)
MCH: 27.7 pg (ref 26.0–34.0)
MCHC: 32.6 g/dL (ref 30.0–36.0)
MCV: 85 fL (ref 78.0–100.0)
Platelets: 266 10*3/uL (ref 150–400)
RDW: 13.6 % (ref 11.5–15.5)

## 2010-10-24 ENCOUNTER — Encounter (HOSPITAL_BASED_OUTPATIENT_CLINIC_OR_DEPARTMENT_OTHER): Payer: 59 | Admitting: Oncology

## 2010-10-24 ENCOUNTER — Other Ambulatory Visit: Payer: Self-pay | Admitting: Oncology

## 2010-10-24 DIAGNOSIS — C50519 Malignant neoplasm of lower-outer quadrant of unspecified female breast: Secondary | ICD-10-CM

## 2010-10-24 DIAGNOSIS — C50919 Malignant neoplasm of unspecified site of unspecified female breast: Secondary | ICD-10-CM

## 2010-10-24 LAB — CBC WITH DIFFERENTIAL/PLATELET
BASO%: 0.6 % (ref 0.0–2.0)
Basophils Absolute: 0 10*3/uL (ref 0.0–0.1)
EOS%: 6.3 % (ref 0.0–7.0)
HGB: 12.6 g/dL (ref 11.6–15.9)
MCH: 28.7 pg (ref 25.1–34.0)
MCHC: 34.4 g/dL (ref 31.5–36.0)
MCV: 83.5 fL (ref 79.5–101.0)
MONO%: 7.9 % (ref 0.0–14.0)
RDW: 13.8 % (ref 11.2–14.5)
lymph#: 2.3 10*3/uL (ref 0.9–3.3)

## 2010-10-25 ENCOUNTER — Ambulatory Visit: Payer: 59 | Attending: Gynecologic Oncology | Admitting: Gynecologic Oncology

## 2010-10-25 DIAGNOSIS — Z90710 Acquired absence of both cervix and uterus: Secondary | ICD-10-CM | POA: Insufficient documentation

## 2010-10-25 DIAGNOSIS — C50919 Malignant neoplasm of unspecified site of unspecified female breast: Secondary | ICD-10-CM | POA: Insufficient documentation

## 2010-10-25 DIAGNOSIS — C549 Malignant neoplasm of corpus uteri, unspecified: Secondary | ICD-10-CM | POA: Insufficient documentation

## 2010-10-25 DIAGNOSIS — Z170 Estrogen receptor positive status [ER+]: Secondary | ICD-10-CM | POA: Insufficient documentation

## 2010-10-25 DIAGNOSIS — Z9079 Acquired absence of other genital organ(s): Secondary | ICD-10-CM | POA: Insufficient documentation

## 2010-11-01 NOTE — Consult Note (Signed)
NAMEYUKTHA, KERCHNER            ACCOUNT NO.:  [id number]  MEDICAL RECORD NO.:  [id number]  LOCATION:  GYN                          FACILITY:  [facility]  PHYSICIAN:  Jamilla Galli A. Duard Brady, MD    DATE OF BIRTH:  [birth year]  DATE OF CONSULTATION:  10/25/2010 DATE OF DISCHARGE:                                CONSULTATION   Ms. Tonkinson is a very pleasant 60 year old, gravida 0, who was diagnosed with breast cancer in 55 at the age of 77.  At that time, she underwent mastectomy and received postoperative chemotherapy for 6 months.  She was seen by Dr. Ricki Miller in April 2012, who referred her for a spiculated 3 cm mass as well as enlarged left axillary node.  Biopsies revealed an invasive ductal carcinoma, ER positive, PR positive, HER-2 negative.  MRI showed a mass measuring 3 x 2.6 x 3.2 cm.  PET/CT revealed uptake in the left breast, uterine fundus, and endometrial cavity.  She underwent an endometrial biopsy by Dr. Waynard Reeds, which revealed a grade 1 endometrioid adenocarcinoma.  The patient of note is status post mastectomy with reconstruction on May 25.  We initially saw her on September 12, 2010.  She underwent robotic hysterectomy, BSO, and right pelvic lymph node dissection.  Operative room findings included a normal size uterus, redundant rectosigmoid colon with physiologic adhesions into the left pelvic sidewall and left adnexa.  Frozen section with a grade 1 tumor with adenomyosis with no definitive myometrial involvement.  Pathology was consistent with a grade 1 tumor, with superficial myometrial invasion of 0.2 cm and the myometrium was 1.4 cm in thickness.  The tumor size was 2.2 cm.  There was no cervical stromal involvement.  There was no lymphovascular space involvement.  Zero out of six lymph nodes were involved.  She comes in today for her postoperative check.  She is overall doing quite well from a surgical standpoint.  She did have some spotting this week and she thinks  she might have been too active.  She did get a Port-A-Cath last week and a cycle of chemotherapy.  She has questions about going back to work.  She previously had a fairly sedentary desk job, but I guess over the time of her absent, the job has become much more physical with some heavy lifting.  She does not exactly know what the job involves, so it is difficult to ascertain whether she is able to go back with some restrictions or no restrictions at this time.  PHYSICAL EXAMINATION:  VITAL SIGNS:  Weight 184 pounds, blood pressure 118/78, temperature 98, respirations 16. GENERAL:  Well-nourished, well-developed female, in no acute distress. ABDOMEN:  Shows well-healed surgical incisions.  Abdomen is soft and minimally tender to deep palpation in the mid lower quadrant. PELVIC:  External genitalia are within normal limits.  The vagina is markedly atrophic.  The vaginal cuff is visualized.  There is a small area of eschar measuring approximately 3 to 4 mm.  Suture line is otherwise intact.  Sutures are still visible.  Bimanual examination reveals no cuff tenderness, no masses.  There are postoperative changes.  ASSESSMENT: 39. A 60 year old with a history of breast cancer, who also has  a stage     IA grade 1 endometrioid adenocarcinoma.  With regard to     endometrioid cancer, she does not require any additional treatment.     She does not need to close follow up.  She will return to see [place] in     6 months. 2. With regard to her breast cancer, she will continue her followup     with Dr. Donnie Coffin with recommendations for chemotherapy.  She is not     sure what the next step will be after her chemotherapy whether or     not she will need any additional treatment. 3. With return to work, she will contact me and see Telford Nab next     week with information regarding her new job     description.  If the patient does have a sedentary desk job, I have     no opposition to her returning to  work from my perspective.  She     has not discussed this with Dr. Donnie Coffin.  If there is heavy lifting     involved, we need to consider not just the hysterectomy and the     abdominal incisions but the Port-A-Cath surgery as well.     Sreeja Spies A. Duard Brady, MD     PAG/MEDQ  D:  10/25/2010  T:  10/26/2010  Job:  [id number]  cc:   Pierce Crane, M.D., F.R.C.P.C. Fax: [phone]  Telford Nab, R.N. [address] [place], [place] [address]  Juline Patch, M.D. Fax: [phone]  Freddrick March. Tenny Craw, MD Fax: [phone]  Electronically Signed by Cleda Mccreedy MD on 11/01/2010 01:44:24 PM

## 2010-11-08 ENCOUNTER — Encounter (HOSPITAL_BASED_OUTPATIENT_CLINIC_OR_DEPARTMENT_OTHER): Payer: 59 | Admitting: Oncology

## 2010-11-08 ENCOUNTER — Other Ambulatory Visit: Payer: Self-pay | Admitting: Oncology

## 2010-11-08 DIAGNOSIS — C50219 Malignant neoplasm of upper-inner quadrant of unspecified female breast: Secondary | ICD-10-CM

## 2010-11-08 DIAGNOSIS — Z170 Estrogen receptor positive status [ER+]: Secondary | ICD-10-CM

## 2010-11-08 DIAGNOSIS — C50519 Malignant neoplasm of lower-outer quadrant of unspecified female breast: Secondary | ICD-10-CM

## 2010-11-08 DIAGNOSIS — Z5111 Encounter for antineoplastic chemotherapy: Secondary | ICD-10-CM

## 2010-11-08 DIAGNOSIS — C50919 Malignant neoplasm of unspecified site of unspecified female breast: Secondary | ICD-10-CM

## 2010-11-08 LAB — CBC WITH DIFFERENTIAL/PLATELET
EOS%: 0.9 % (ref 0.0–7.0)
Eosinophils Absolute: 0.1 10*3/uL (ref 0.0–0.5)
HGB: 11.8 g/dL (ref 11.6–15.9)
MCH: 27.7 pg (ref 25.1–34.0)
MCV: 82.6 fL (ref 79.5–101.0)
MONO%: 9.4 % (ref 0.0–14.0)
NEUT#: 4.6 10*3/uL (ref 1.5–6.5)
RBC: 4.26 10*6/uL (ref 3.70–5.45)
RDW: 14.6 % — ABNORMAL HIGH (ref 11.2–14.5)
lymph#: 2 10*3/uL (ref 0.9–3.3)
nRBC: 0 % (ref 0–0)

## 2010-11-08 LAB — BASIC METABOLIC PANEL
Calcium: 9.7 mg/dL (ref 8.4–10.5)
Potassium: 3.7 mEq/L (ref 3.5–5.3)
Sodium: 139 mEq/L (ref 135–145)

## 2010-11-09 ENCOUNTER — Encounter (HOSPITAL_BASED_OUTPATIENT_CLINIC_OR_DEPARTMENT_OTHER): Payer: 59 | Admitting: Oncology

## 2010-11-09 DIAGNOSIS — C50219 Malignant neoplasm of upper-inner quadrant of unspecified female breast: Secondary | ICD-10-CM

## 2010-11-09 DIAGNOSIS — C50519 Malignant neoplasm of lower-outer quadrant of unspecified female breast: Secondary | ICD-10-CM

## 2010-11-09 DIAGNOSIS — Z5189 Encounter for other specified aftercare: Secondary | ICD-10-CM

## 2010-11-15 ENCOUNTER — Encounter (HOSPITAL_BASED_OUTPATIENT_CLINIC_OR_DEPARTMENT_OTHER): Payer: 59 | Admitting: Oncology

## 2010-11-15 ENCOUNTER — Other Ambulatory Visit: Payer: Self-pay | Admitting: Physician Assistant

## 2010-11-15 DIAGNOSIS — C50519 Malignant neoplasm of lower-outer quadrant of unspecified female breast: Secondary | ICD-10-CM

## 2010-11-15 DIAGNOSIS — C50919 Malignant neoplasm of unspecified site of unspecified female breast: Secondary | ICD-10-CM

## 2010-11-15 LAB — CBC WITH DIFFERENTIAL/PLATELET
Basophils Absolute: 0.1 10*3/uL (ref 0.0–0.1)
Eosinophils Absolute: 0.4 10*3/uL (ref 0.0–0.5)
HCT: 33.1 % — ABNORMAL LOW (ref 34.8–46.6)
HGB: 11.2 g/dL — ABNORMAL LOW (ref 11.6–15.9)
LYMPH%: 52.1 % — ABNORMAL HIGH (ref 14.0–49.7)
MONO#: 0.4 10*3/uL (ref 0.1–0.9)
NEUT%: 17.3 % — ABNORMAL LOW (ref 38.4–76.8)
Platelets: 249 10*3/uL (ref 145–400)
WBC: 3.1 10*3/uL — ABNORMAL LOW (ref 3.9–10.3)
lymph#: 1.6 10*3/uL (ref 0.9–3.3)

## 2010-11-28 ENCOUNTER — Encounter (HOSPITAL_BASED_OUTPATIENT_CLINIC_OR_DEPARTMENT_OTHER): Payer: 59 | Admitting: Oncology

## 2010-11-28 ENCOUNTER — Other Ambulatory Visit: Payer: Self-pay | Admitting: Physician Assistant

## 2010-11-28 DIAGNOSIS — C50519 Malignant neoplasm of lower-outer quadrant of unspecified female breast: Secondary | ICD-10-CM

## 2010-11-28 DIAGNOSIS — Z5189 Encounter for other specified aftercare: Secondary | ICD-10-CM

## 2010-11-28 DIAGNOSIS — Z5111 Encounter for antineoplastic chemotherapy: Secondary | ICD-10-CM

## 2010-11-28 DIAGNOSIS — C50919 Malignant neoplasm of unspecified site of unspecified female breast: Secondary | ICD-10-CM

## 2010-11-28 DIAGNOSIS — C50219 Malignant neoplasm of upper-inner quadrant of unspecified female breast: Secondary | ICD-10-CM

## 2010-11-28 LAB — CBC WITH DIFFERENTIAL/PLATELET
Basophils Absolute: 0.1 10*3/uL (ref 0.0–0.1)
Eosinophils Absolute: 0.1 10*3/uL (ref 0.0–0.5)
HCT: 35.5 % (ref 34.8–46.6)
HGB: 12 g/dL (ref 11.6–15.9)
MCV: 82.6 fL (ref 79.5–101.0)
MONO%: 6.9 % (ref 0.0–14.0)
NEUT#: 4.5 10*3/uL (ref 1.5–6.5)
NEUT%: 65.3 % (ref 38.4–76.8)
Platelets: 295 10*3/uL (ref 145–400)
RDW: 15.5 % — ABNORMAL HIGH (ref 11.2–14.5)

## 2010-11-28 LAB — COMPREHENSIVE METABOLIC PANEL
Albumin: 4.3 g/dL (ref 3.5–5.2)
BUN: 18 mg/dL (ref 6–23)
CO2: 28 mEq/L (ref 19–32)
Glucose, Bld: 122 mg/dL — ABNORMAL HIGH (ref 70–99)
Sodium: 136 mEq/L (ref 135–145)
Total Bilirubin: 0.4 mg/dL (ref 0.3–1.2)
Total Protein: 6.9 g/dL (ref 6.0–8.3)

## 2010-11-29 ENCOUNTER — Encounter (HOSPITAL_BASED_OUTPATIENT_CLINIC_OR_DEPARTMENT_OTHER): Payer: 59 | Admitting: Oncology

## 2010-11-29 DIAGNOSIS — C50219 Malignant neoplasm of upper-inner quadrant of unspecified female breast: Secondary | ICD-10-CM

## 2010-11-29 DIAGNOSIS — Z5189 Encounter for other specified aftercare: Secondary | ICD-10-CM

## 2010-11-29 DIAGNOSIS — C50519 Malignant neoplasm of lower-outer quadrant of unspecified female breast: Secondary | ICD-10-CM

## 2010-12-05 ENCOUNTER — Other Ambulatory Visit: Payer: Self-pay | Admitting: Physician Assistant

## 2010-12-05 ENCOUNTER — Encounter (HOSPITAL_BASED_OUTPATIENT_CLINIC_OR_DEPARTMENT_OTHER): Payer: 59 | Admitting: Oncology

## 2010-12-05 DIAGNOSIS — C50519 Malignant neoplasm of lower-outer quadrant of unspecified female breast: Secondary | ICD-10-CM

## 2010-12-05 DIAGNOSIS — C50919 Malignant neoplasm of unspecified site of unspecified female breast: Secondary | ICD-10-CM

## 2010-12-05 DIAGNOSIS — Z5189 Encounter for other specified aftercare: Secondary | ICD-10-CM

## 2010-12-05 DIAGNOSIS — Z5111 Encounter for antineoplastic chemotherapy: Secondary | ICD-10-CM

## 2010-12-05 LAB — CBC WITH DIFFERENTIAL/PLATELET
Eosinophils Absolute: 0.1 10*3/uL (ref 0.0–0.5)
MONO#: 1.1 10*3/uL — ABNORMAL HIGH (ref 0.1–0.9)
NEUT#: 0.6 10*3/uL — ABNORMAL LOW (ref 1.5–6.5)
Platelets: 234 10*3/uL (ref 145–400)
RBC: 3.83 10*6/uL (ref 3.70–5.45)
RDW: 15.6 % — ABNORMAL HIGH (ref 11.2–14.5)
WBC: 3.1 10*3/uL — ABNORMAL LOW (ref 3.9–10.3)
nRBC: 1 % — ABNORMAL HIGH (ref 0–0)

## 2010-12-19 ENCOUNTER — Other Ambulatory Visit: Payer: Self-pay | Admitting: Physician Assistant

## 2010-12-19 ENCOUNTER — Encounter (HOSPITAL_BASED_OUTPATIENT_CLINIC_OR_DEPARTMENT_OTHER): Payer: 59 | Admitting: Oncology

## 2010-12-19 DIAGNOSIS — C50519 Malignant neoplasm of lower-outer quadrant of unspecified female breast: Secondary | ICD-10-CM

## 2010-12-19 DIAGNOSIS — C50919 Malignant neoplasm of unspecified site of unspecified female breast: Secondary | ICD-10-CM

## 2010-12-19 DIAGNOSIS — Z5111 Encounter for antineoplastic chemotherapy: Secondary | ICD-10-CM

## 2010-12-19 DIAGNOSIS — C50219 Malignant neoplasm of upper-inner quadrant of unspecified female breast: Secondary | ICD-10-CM

## 2010-12-19 LAB — COMPREHENSIVE METABOLIC PANEL
AST: 12 U/L (ref 0–37)
Albumin: 4.2 g/dL (ref 3.5–5.2)
BUN: 22 mg/dL (ref 6–23)
Calcium: 9.6 mg/dL (ref 8.4–10.5)
Chloride: 102 mEq/L (ref 96–112)
Glucose, Bld: 139 mg/dL — ABNORMAL HIGH (ref 70–99)
Potassium: 4 mEq/L (ref 3.5–5.3)

## 2010-12-19 LAB — CBC WITH DIFFERENTIAL/PLATELET
Basophils Absolute: 0.1 10*3/uL (ref 0.0–0.1)
HCT: 31.6 % — ABNORMAL LOW (ref 34.8–46.6)
HGB: 10.5 g/dL — ABNORMAL LOW (ref 11.6–15.9)
MONO#: 0.5 10*3/uL (ref 0.1–0.9)
NEUT%: 65 % (ref 38.4–76.8)
WBC: 6.2 10*3/uL (ref 3.9–10.3)
lymph#: 1.5 10*3/uL (ref 0.9–3.3)

## 2010-12-26 ENCOUNTER — Other Ambulatory Visit: Payer: Self-pay | Admitting: Oncology

## 2010-12-26 ENCOUNTER — Other Ambulatory Visit: Payer: Self-pay | Admitting: Physician Assistant

## 2010-12-26 ENCOUNTER — Encounter (HOSPITAL_BASED_OUTPATIENT_CLINIC_OR_DEPARTMENT_OTHER): Payer: 59 | Admitting: Oncology

## 2010-12-26 DIAGNOSIS — C50919 Malignant neoplasm of unspecified site of unspecified female breast: Secondary | ICD-10-CM

## 2010-12-26 DIAGNOSIS — Z9010 Acquired absence of unspecified breast and nipple: Secondary | ICD-10-CM

## 2010-12-26 DIAGNOSIS — Z8542 Personal history of malignant neoplasm of other parts of uterus: Secondary | ICD-10-CM

## 2010-12-26 DIAGNOSIS — Z79899 Other long term (current) drug therapy: Secondary | ICD-10-CM

## 2010-12-26 DIAGNOSIS — Z853 Personal history of malignant neoplasm of breast: Secondary | ICD-10-CM

## 2010-12-26 LAB — CBC WITH DIFFERENTIAL/PLATELET
BASO%: 5.6 % — ABNORMAL HIGH (ref 0.0–2.0)
Basophils Absolute: 0.1 10*3/uL (ref 0.0–0.1)
EOS%: 1.4 % (ref 0.0–7.0)
HGB: 10.5 g/dL — ABNORMAL LOW (ref 11.6–15.9)
MCH: 28.3 pg (ref 25.1–34.0)
NEUT#: 0.5 10*3/uL — ABNORMAL LOW (ref 1.5–6.5)
RBC: 3.71 10*6/uL (ref 3.70–5.45)
RDW: 16.4 % — ABNORMAL HIGH (ref 11.2–14.5)
lymph#: 0.7 10*3/uL — ABNORMAL LOW (ref 0.9–3.3)
nRBC: 0 % (ref 0–0)

## 2011-01-01 ENCOUNTER — Encounter (HOSPITAL_BASED_OUTPATIENT_CLINIC_OR_DEPARTMENT_OTHER): Payer: 59 | Admitting: Oncology

## 2011-01-01 ENCOUNTER — Other Ambulatory Visit: Payer: Self-pay | Admitting: Physician Assistant

## 2011-01-01 DIAGNOSIS — C50519 Malignant neoplasm of lower-outer quadrant of unspecified female breast: Secondary | ICD-10-CM

## 2011-01-01 DIAGNOSIS — Z5189 Encounter for other specified aftercare: Secondary | ICD-10-CM

## 2011-01-01 DIAGNOSIS — Z5111 Encounter for antineoplastic chemotherapy: Secondary | ICD-10-CM

## 2011-01-01 DIAGNOSIS — C50919 Malignant neoplasm of unspecified site of unspecified female breast: Secondary | ICD-10-CM

## 2011-01-01 LAB — CBC WITH DIFFERENTIAL/PLATELET
BASO%: 3.4 % — ABNORMAL HIGH (ref 0.0–2.0)
Eosinophils Absolute: 0.1 10*3/uL (ref 0.0–0.5)
MONO#: 0.9 10*3/uL (ref 0.1–0.9)
NEUT#: 0.3 10*3/uL — CL (ref 1.5–6.5)
Platelets: 298 10*3/uL (ref 145–400)
RBC: 3.66 10*6/uL — ABNORMAL LOW (ref 3.70–5.45)
RDW: 16.3 % — ABNORMAL HIGH (ref 11.2–14.5)
WBC: 2.4 10*3/uL — ABNORMAL LOW (ref 3.9–10.3)
lymph#: 1 10*3/uL (ref 0.9–3.3)
nRBC: 0 % (ref 0–0)

## 2011-01-02 ENCOUNTER — Ambulatory Visit
Admission: RE | Admit: 2011-01-02 | Discharge: 2011-01-02 | Disposition: A | Discharge status: Home / Self Care | Payer: 59 | Source: Ambulatory Visit | Attending: Oncology | Admitting: Oncology

## 2011-01-02 DIAGNOSIS — Z853 Personal history of malignant neoplasm of breast: Secondary | ICD-10-CM

## 2011-01-10 ENCOUNTER — Other Ambulatory Visit: Payer: Self-pay | Admitting: Physician Assistant

## 2011-01-10 ENCOUNTER — Other Ambulatory Visit (HOSPITAL_BASED_OUTPATIENT_CLINIC_OR_DEPARTMENT_OTHER): Payer: 59 | Admitting: Lab

## 2011-01-10 DIAGNOSIS — Z5189 Encounter for other specified aftercare: Secondary | ICD-10-CM

## 2011-01-10 DIAGNOSIS — Z5111 Encounter for antineoplastic chemotherapy: Secondary | ICD-10-CM

## 2011-01-10 DIAGNOSIS — C50919 Malignant neoplasm of unspecified site of unspecified female breast: Secondary | ICD-10-CM

## 2011-01-10 DIAGNOSIS — C50519 Malignant neoplasm of lower-outer quadrant of unspecified female breast: Secondary | ICD-10-CM

## 2011-01-10 LAB — CBC WITH DIFFERENTIAL/PLATELET
Basophils Absolute: 0.1 10*3/uL (ref 0.0–0.1)
HCT: 32.4 % — ABNORMAL LOW (ref 34.8–46.6)
HGB: 10.8 g/dL — ABNORMAL LOW (ref 11.6–15.9)
MONO#: 0.6 10*3/uL (ref 0.1–0.9)
NEUT#: 4.9 10*3/uL (ref 1.5–6.5)
NEUT%: 67 % (ref 38.4–76.8)
RDW: 16.6 % — ABNORMAL HIGH (ref 11.2–14.5)
WBC: 7.3 10*3/uL (ref 3.9–10.3)
lymph#: 1.7 10*3/uL (ref 0.9–3.3)

## 2011-01-15 ENCOUNTER — Telehealth: Payer: Self-pay | Admitting: *Deleted

## 2011-01-15 NOTE — Telephone Encounter (Signed)
Pt called and stated she would like results from her bone scan.  Will review with MD

## 2011-01-21 NOTE — Telephone Encounter (Signed)
Angie If results are normal or do not show definitive evidence of cancer, ls tell pt [name]

## 2011-01-23 ENCOUNTER — Telehealth: Payer: Self-pay | Admitting: *Deleted

## 2011-01-23 NOTE — Telephone Encounter (Signed)
Per MD, attempted to notify pt of "normal" bone scan

## 2011-01-29 ENCOUNTER — Other Ambulatory Visit: Payer: Self-pay | Admitting: *Deleted

## 2011-01-29 ENCOUNTER — Telehealth: Payer: Self-pay | Admitting: *Deleted

## 2011-01-29 ENCOUNTER — Telehealth: Payer: Self-pay | Admitting: Oncology

## 2011-01-29 DIAGNOSIS — C50519 Malignant neoplasm of lower-outer quadrant of unspecified female breast: Secondary | ICD-10-CM

## 2011-01-29 NOTE — Telephone Encounter (Signed)
Notified patient of normal bone scan results per Dr Donnie Coffin.  Pt also informed of 01/10/11 lab results.  Pt stated that she needed pac flushed.  RN advised patient that someone in scheduling will be calling to schedule pac flush.

## 2011-01-29 NOTE — Telephone Encounter (Signed)
called pt and scheduled port flush appt for 11/30

## 2011-02-01 ENCOUNTER — Other Ambulatory Visit: Payer: Self-pay | Admitting: *Deleted

## 2011-02-02 ENCOUNTER — Ambulatory Visit (HOSPITAL_BASED_OUTPATIENT_CLINIC_OR_DEPARTMENT_OTHER): Payer: 59

## 2011-02-02 DIAGNOSIS — C50219 Malignant neoplasm of upper-inner quadrant of unspecified female breast: Secondary | ICD-10-CM

## 2011-02-02 DIAGNOSIS — C50919 Malignant neoplasm of unspecified site of unspecified female breast: Secondary | ICD-10-CM

## 2011-02-02 DIAGNOSIS — C50519 Malignant neoplasm of lower-outer quadrant of unspecified female breast: Secondary | ICD-10-CM

## 2011-02-02 DIAGNOSIS — Z452 Encounter for adjustment and management of vascular access device: Secondary | ICD-10-CM

## 2011-02-02 MED ORDER — HEPARIN SOD (PORK) LOCK FLUSH 100 UNIT/ML IV SOLN
500.0000 [IU] | Freq: Once | INTRAVENOUS | Status: AC
  Administered 2011-02-02: 500 [IU] via INTRAVENOUS
  Filled 2011-02-02: qty 5

## 2011-02-02 MED ORDER — SODIUM CHLORIDE 0.9 % IJ SOLN
10.0000 mL | INTRAMUSCULAR | Status: DC | PRN
  Administered 2011-02-02: 10 mL via INTRAVENOUS
  Filled 2011-02-02: qty 10

## 2011-03-05 ENCOUNTER — Other Ambulatory Visit: Payer: Self-pay | Admitting: Plastic Surgery

## 2011-03-09 ENCOUNTER — Encounter: Payer: Self-pay | Admitting: *Deleted

## 2011-03-09 NOTE — Progress Notes (Signed)
Pt had left breast removal of tissue expander with placement of implant and right breast removal of implant, reconstruct with implant, capsulorrhaphy on 03/05/11 by Dr. Etter Sjogren.

## 2011-03-13 ENCOUNTER — Other Ambulatory Visit: Payer: 59 | Admitting: Lab

## 2011-03-13 ENCOUNTER — Ambulatory Visit: Payer: 59 | Admitting: Oncology

## 2011-03-14 ENCOUNTER — Encounter: Payer: Self-pay | Admitting: *Deleted

## 2011-04-18 ENCOUNTER — Other Ambulatory Visit: Payer: Self-pay | Admitting: *Deleted

## 2011-04-18 ENCOUNTER — Other Ambulatory Visit: Payer: 59 | Admitting: Lab

## 2011-04-18 ENCOUNTER — Ambulatory Visit: Payer: 59 | Admitting: Oncology

## 2011-04-19 ENCOUNTER — Telehealth: Payer: Self-pay | Admitting: Oncology

## 2011-04-19 NOTE — Telephone Encounter (Signed)
lmonvm adviisng the pt of her r/s appts that she missed in feb

## 2011-05-22 ENCOUNTER — Ambulatory Visit: Payer: 59 | Admitting: Oncology

## 2011-05-22 ENCOUNTER — Other Ambulatory Visit: Payer: 59 | Admitting: Lab

## 2011-05-23 ENCOUNTER — Telehealth: Payer: Self-pay | Admitting: *Deleted

## 2011-05-23 ENCOUNTER — Other Ambulatory Visit: Payer: Self-pay | Admitting: *Deleted

## 2011-05-23 NOTE — Telephone Encounter (Signed)
left voice message to inform the patient of the new date and time for the missed appointment

## 2011-05-24 ENCOUNTER — Telehealth: Payer: Self-pay | Admitting: *Deleted

## 2011-05-24 NOTE — Telephone Encounter (Signed)
gave patient left voice message to inform the patient to the new date and time on 07-13-2011

## 2011-07-05 ENCOUNTER — Telehealth: Payer: Self-pay | Admitting: Oncology

## 2011-07-05 NOTE — Telephone Encounter (Signed)
lmonvm adviisng the pt that her may appts had to be cancelled and r/s to June due to the md will be out of the office.

## 2011-07-13 ENCOUNTER — Ambulatory Visit: Payer: 59 | Admitting: Oncology

## 2011-07-13 ENCOUNTER — Other Ambulatory Visit: Payer: 59 | Admitting: Lab

## 2011-07-23 ENCOUNTER — Other Ambulatory Visit: Payer: 59 | Admitting: Lab

## 2011-08-28 ENCOUNTER — Other Ambulatory Visit: Payer: 59 | Admitting: Lab

## 2011-08-28 ENCOUNTER — Other Ambulatory Visit: Payer: Self-pay | Admitting: *Deleted

## 2011-08-28 ENCOUNTER — Ambulatory Visit: Payer: 59 | Admitting: Oncology

## 2011-08-28 ENCOUNTER — Telehealth: Payer: Self-pay | Admitting: *Deleted

## 2011-08-28 NOTE — Telephone Encounter (Signed)
patient confirmed over the phone the new date and time 

## 2011-10-05 ENCOUNTER — Encounter: Payer: Self-pay | Admitting: Gastroenterology

## 2011-10-30 ENCOUNTER — Ambulatory Visit: Payer: 59 | Admitting: Family

## 2011-10-30 ENCOUNTER — Other Ambulatory Visit: Payer: 59 | Admitting: Lab

## 2011-10-30 NOTE — Progress Notes (Signed)
No show

## 2011-11-06 ENCOUNTER — Ambulatory Visit: Payer: 59 | Admitting: Internal Medicine

## 2011-11-13 ENCOUNTER — Ambulatory Visit (INDEPENDENT_AMBULATORY_CARE_PROVIDER_SITE_OTHER): Payer: 59 | Admitting: Gastroenterology

## 2011-11-13 ENCOUNTER — Encounter: Payer: Self-pay | Admitting: Gastroenterology

## 2011-11-13 VITALS — BP 124/70 | HR 74 | Ht 65.0 in | Wt 177.0 lb

## 2011-11-13 DIAGNOSIS — K2270 Barrett's esophagus without dysplasia: Secondary | ICD-10-CM

## 2011-11-13 DIAGNOSIS — K625 Hemorrhage of anus and rectum: Secondary | ICD-10-CM

## 2011-11-13 MED ORDER — MOVIPREP 100 G PO SOLR: 1.0000 | ORAL | Status: DC

## 2011-11-13 NOTE — Patient Instructions (Addendum)
You will be set up for a colonoscopy for minor rectal bleeding (your last examination was 9 years ago). (LEC moderate sedation) You will be set up for an upper endoscopy for Barretts surveillance (last examination was 7 years ago).

## 2011-11-13 NOTE — Progress Notes (Signed)
HPI: This is a    very pleasant 61 year old woman whom I am meeting for the first time today.  Barretts without dypslasia noted 2004, 2006 EGd Dr. Virginia Rochester ( he described these areas as having extensive Barrett's change).  On PPI for pyrosis. No dypshagia.  Overall stable weight. + minor rectal bleeding on one occasion about 2-3 weeks ago.   No colon cancer.  No changes in her bowels.    Colonoscopy in 2004 as well, no polyps no cancer.    This was done for Hemoccult-positive stool, was "an unremarkable examination"    Review of systems: Pertinent positive and negative review of systems were noted in the above HPI section. Complete review of systems was performed and was otherwise normal.    Past Medical History  Diagnosis Date  . Breast cancer   . Barrett's esophagus   . GERD (gastroesophageal reflux disease)   . Hiatal hernia   . Fundic gland polyps of stomach, benign   . Endometrioid adenocarcinoma   . Hypertension     Past Surgical History  Procedure Date  . Mastectomy   . Endometrial biopsy   . Total abdominal hysterectomy   . Breast reconstruction     Current Outpatient Prescriptions  Medication Sig Dispense Refill  . acetaminophen (TYLENOL) 325 MG tablet Take 650 mg by mouth every 6 (six) hours as needed.      Marland Kitchen lisinopril-hydrochlorothiazide (PRINZIDE,ZESTORETIC) 20-12.5 MG per tablet Take 1 tablet by mouth daily.      . pantoprazole (PROTONIX) 40 MG tablet Take 40 mg by mouth daily.        Allergies as of 11/13/2011 - Review Complete 11/13/2011  Allergen Reaction Noted  . Penicillins  11/13/2011    Family History  Problem Relation Age of Onset  . Brain cancer Maternal Uncle   . Breast cancer Mother   . Breast cancer Maternal Aunt     History   Social History  . Marital Status: Married    Spouse Name: N/A    Number of Children: N/A  . Years of Education: N/A   Occupational History  . Sales [employer]   Social History Main Topics  . Smoking  status: Never Smoker   . Smokeless tobacco: Never Used  . Alcohol Use: Yes     rarely  . Drug Use: No  . Sexually Active: Not on file   Other Topics Concern  . Not on file   Social History Narrative  . No narrative on file       Physical Exam: BP 124/70  Pulse 74  Ht 5\' 5"  (1.651 m)  Wt 177 lb (80.287 kg)  BMI 29.45 kg/m2 Constitutional: generally well-appearing Psychiatric: alert and oriented x3 Eyes: extraocular movements intact Mouth: oral pharynx moist, no lesions Neck: supple no lymphadenopathy Cardiovascular: heart regular rate and rhythm Lungs: clear to auscultation bilaterally Abdomen: soft, nontender, nondistended, no obvious ascites, no peritoneal signs, normal bowel sounds Extremities: no lower extremity edema bilaterally Skin: no lesions on visible extremities    Assessment and plan: 61 y.o. female with  minor rectal bleeding, known Barrett's esophagus  Her last EGD was about 7 years ago. She has no alarm symptoms now but I would like to proceed with repeat EGD to survey the Barrett's changes, repeat biopsies. At the same time I will proceed with colonoscopy. Her last one was about 9 years ago she has seen some minor rectal bleeding since then.

## 2011-12-04 ENCOUNTER — Ambulatory Visit (AMBULATORY_SURGERY_CENTER): Payer: 59 | Admitting: Gastroenterology

## 2011-12-04 ENCOUNTER — Encounter: Payer: Self-pay | Admitting: Gastroenterology

## 2011-12-04 VITALS — BP 130/88 | HR 74 | Temp 97.8°F | Resp 19 | Ht 65.0 in | Wt 177.0 lb

## 2011-12-04 DIAGNOSIS — K219 Gastro-esophageal reflux disease without esophagitis: Secondary | ICD-10-CM

## 2011-12-04 DIAGNOSIS — K2270 Barrett's esophagus without dysplasia: Secondary | ICD-10-CM

## 2011-12-04 DIAGNOSIS — K449 Diaphragmatic hernia without obstruction or gangrene: Secondary | ICD-10-CM

## 2011-12-04 DIAGNOSIS — K625 Hemorrhage of anus and rectum: Secondary | ICD-10-CM

## 2011-12-04 DIAGNOSIS — Z1211 Encounter for screening for malignant neoplasm of colon: Secondary | ICD-10-CM

## 2011-12-04 MED ORDER — SODIUM CHLORIDE 0.9 % IV SOLN: 500.0000 mL | INTRAVENOUS | Status: DC

## 2011-12-04 NOTE — Patient Instructions (Signed)

## 2011-12-04 NOTE — Progress Notes (Addendum)
Pt awoke abruptly from EGD and fighting with procedure while we were attempting bx. Pt flailing head back and forth and yelling with scope in place. Attempted t calm down, replace bite block, unable to do finish exam and bx. Pt uncooperative and combative. When procedure over, removed bite block, teeth intact and not loose but gums are bloody, informed patient and Dr. Christella Hartigan.

## 2011-12-04 NOTE — Progress Notes (Signed)
In recovery room , right lower gum slightly bloody as procedure room nurse reported & noted above.Pt & carepartner made aware by Dr Christella Hartigan. Encouraged pt . To rinse mouth with warm salt water prn for gum soreness.

## 2011-12-04 NOTE — Progress Notes (Signed)
Patient did not have preoperative order for IV antibiotic SSI prophylaxis. (G8918)  Patient did not experience any of the following events: a burn prior to discharge; a fall within the facility; wrong site/side/patient/procedure/implant event; or a hospital transfer or hospital admission upon discharge from the facility. (G8907)  

## 2011-12-04 NOTE — Op Note (Signed)
[facility] [address] [place], [address]   COLONOSCOPY PROCEDURE REPORT  PATIENT: [name], [name]  MR#: [id number] BIRTHDATE: [birth year] , 61  yrs. old GENDER: Female ENDOSCOPIST: Rachael Fee, MD REFERRED [id number] Ricki Miller, M.D. PROCEDURE DATE:  12/04/2011 PROCEDURE:   Colonoscopy, diagnostic ASA CLASS:   Class III INDICATIONS:minor rectal bleeding. MEDICATIONS: Fentanyl 75 mcg IV, Versed 8 mg IV, and These medications were titrated to patient response per physician's verbal order  DESCRIPTION OF PROCEDURE:   After the risks benefits and alternatives of the procedure were thoroughly explained, informed consent was obtained.  A digital rectal exam revealed no rectal mass.   The LB [id number]-[id number]  endoscope was introduced through the anus and advanced to the cecum, which was identified by both the appendix and ileocecal valve. No adverse events experienced. The quality of the prep was good.  The instrument was then slowly withdrawn as the colon was fully examined.    COLON FINDINGS: A normal appearing cecum, ileocecal valve, and appendiceal orifice were identified.  The ascending, hepatic flexure, transverse, splenic flexure, descending, sigmoid colon and rectum appeared unremarkable.  No polyps or cancers were seen. Retroflexed views revealed no abnormalities. The time to cecum=2 minutes 32 seconds.  Withdrawal time=6 minutes 29 seconds.  The scope was withdrawn and the procedure completed. COMPLICATIONS: There were no complications.  ENDOSCOPIC IMPRESSION: Normal colon, no polyps or cancers  RECOMMENDATIONS: You should continue to follow colorectal cancer screening guidelines for "routine risk" patients with a repeat colonoscopy in 10 years. There is no need for FOBT (stool) testing for at least 5 years.   eSigned:  Rachael Fee, MD 12/04/2011 3:48 PM

## 2011-12-04 NOTE — Op Note (Signed)
[facility] [address] [place], [address]   ENDOSCOPY PROCEDURE REPORT  PATIENT: [name], [name]  MR#: [id number] BIRTHDATE: Mar 18, 1950 , 61  yrs. old GENDER: Female ENDOSCOPIST: Rachael Fee, MD REFERRED BY: PROCEDURE DATE:  12/04/2011 PROCEDURE:  EGD w/ biopsy ASA CLASS:     Class III INDICATIONS:  known non-dysplastic Barrett's (Dr.  Virginia Rochester patient, last EGD 6-7 years ago). MEDICATIONS: There was residual sedation effect present from prior procedure, Fentanyl 25 mcg IV, Versed 2 mg IV, and These medications were titrated to patient response per physician's verbal order TOPICAL ANESTHETIC: Cetacaine Spray  DESCRIPTION OF PROCEDURE: After the risks benefits and alternatives of the procedure were thoroughly explained, informed consent was obtained.  The LB-GIF Q180 [id number] endoscope was introduced through the mouth and advanced to the second portion of the duodenum. Without limitations.  The instrument was slowly withdrawn as the mucosa was fully examined.   There was a 4cm hiatal hernia and proximal to that was a 1-2cm segment of Barrett's esophagus.  There were two discrete ulcers within the Barrett's change.  She became very agitated, pulling at scope, pulling out bite block at this point and so I was only able to obtain a single biopsy before terminating the examination. Retroflexed views revealed no abnormalities.     The scope was then withdrawn from the patient and the procedure completed. COMPLICATIONS: There were no complications.  ENDOSCOPIC IMPRESSION: Hiatal Hernia Short segment of Barrett's changes with ulceration, incompletely biopsied due to inability to adequately sedate.  RECOMMENDATIONS: My office will get in touch with you to set up repeat EGD with different (deeper) sedation to allow for complete biopsy surveillance of your Barrett's changes.   eSigned:  Rachael Fee, MD 12/04/2011 4:00 PM   [id number] Ricki Miller,  MD

## 2011-12-05 ENCOUNTER — Telehealth: Payer: Self-pay | Admitting: *Deleted

## 2011-12-05 ENCOUNTER — Telehealth: Payer: Self-pay

## 2011-12-05 NOTE — Telephone Encounter (Signed)
  Follow up Call-  Call back number 12/04/2011  Post procedure Call Back phone  # [phone]  Permission to leave phone message Yes     Patient questions:  Do you have a fever, pain , or abdominal swelling? no Pain Score  0 *  Have you tolerated food without any problems? yes  Have you been able to return to your normal activities? yes  Do you have any questions about your discharge instructions: Diet   no Medications  no Follow up visit  no  Do you have questions or concerns about your Care? no  Actions: * If pain score is 4 or above: No action needed, pain <4.  Pt's husband reports that she is still recovering from her sedation. Plans to just take it easy today.

## 2011-12-05 NOTE — Telephone Encounter (Signed)
My office will get in touch with you to set up repeat EGD with  different (deeper) sedation to allow for complete biopsy  surveillance of your Barrett's changes.

## 2011-12-07 NOTE — Telephone Encounter (Signed)
Pt has been scheduled for previsit and EGD letter mailed

## 2011-12-07 NOTE — Telephone Encounter (Signed)
[phone])  Cell Left message on machine to call back

## 2011-12-11 ENCOUNTER — Encounter: Payer: Self-pay | Admitting: Gastroenterology

## 2011-12-21 ENCOUNTER — Encounter: Payer: Self-pay | Admitting: Gastroenterology

## 2011-12-21 ENCOUNTER — Ambulatory Visit (AMBULATORY_SURGERY_CENTER): Payer: 59 | Admitting: *Deleted

## 2011-12-21 VITALS — Ht 65.0 in | Wt 177.0 lb

## 2011-12-21 DIAGNOSIS — K2270 Barrett's esophagus without dysplasia: Secondary | ICD-10-CM

## 2011-12-21 NOTE — Progress Notes (Signed)
No allergy to eggs or soy products  Pt states she is a hard IV stick

## 2011-12-28 ENCOUNTER — Encounter: Payer: Self-pay | Admitting: Gastroenterology

## 2011-12-28 ENCOUNTER — Ambulatory Visit (AMBULATORY_SURGERY_CENTER): Payer: 59 | Admitting: Gastroenterology

## 2011-12-28 VITALS — BP 141/82 | HR 65 | Temp 96.9°F | Resp 19 | Ht 65.0 in | Wt 177.0 lb

## 2011-12-28 DIAGNOSIS — K2270 Barrett's esophagus without dysplasia: Secondary | ICD-10-CM

## 2011-12-28 DIAGNOSIS — R933 Abnormal findings on diagnostic imaging of other parts of digestive tract: Secondary | ICD-10-CM

## 2011-12-28 DIAGNOSIS — K449 Diaphragmatic hernia without obstruction or gangrene: Secondary | ICD-10-CM

## 2011-12-28 MED ORDER — SODIUM CHLORIDE 0.9 % IV SOLN: 500.0000 mL | INTRAVENOUS | Status: DC

## 2011-12-28 NOTE — Op Note (Signed)
[facility] [address] [place], [address]   ENDOSCOPY PROCEDURE REPORT  PATIENT: [name], [name]  MR#: [id number] BIRTHDATE: 01-02-1951 , 61  yrs. old GENDER: Female ENDOSCOPIST: Rachael Fee, MD PROCEDURE DATE:  12/28/2011 PROCEDURE:  EGD w/ biopsy ASA CLASS:     Class III INDICATIONS:  Barretts without dypslasia noted 2004, 2006 EGd Dr. Virginia Rochester ( he described these areas as having extensive Barrett's change).. MEDICATIONS: MAC sedation, administered by CRNA and propofol (Diprivan) 180mg  IV TOPICAL ANESTHETIC: Cetacaine Spray  DESCRIPTION OF PROCEDURE: After the risks benefits and alternatives of the procedure were thoroughly explained, informed consent was obtained.  The LB GIF-H180 [id number] endoscope was introduced through the mouth and advanced to the second portion of the duodenum. Without limitations.  The instrument was slowly withdrawn as the mucosa was fully examined.  There was irregular Zline with single tongue of short segment Barrett's as well as small (2mm) nodule at Z line and clear mild inflammation.  The nodule was biopsied extensively and Barrett's biopsied as well.  There were clearly benign (fundic gland) polyps in stomach.  There was a 3cm hiatal hernia.  The examination was otherwise normal.  Retroflexed views revealed no abnormalities. The scope was then withdrawn from the patient and the procedure completed.  COMPLICATIONS: There were no complications. ENDOSCOPIC IMPRESSION: Irregular Z-line, single tongue of Barrett's with small nodule (inflammatory vs. neoplastic). Biopsied There were clearly benign (fundic gland) polyps in stomach. There was a 3cm hiatal hernia. The examination was otherwise normal.  RECOMMENDATIONS: Await final pathology.   eSigned:  Rachael Fee, MD 12/28/2011 9:17 AM   cc: Antonietta Barcelona, MD

## 2011-12-28 NOTE — Patient Instructions (Addendum)
Discharge instructions given with verbal understanding. Handouts on Barretts and a hiatal hernia given. Resume previous medications. YOU HAD AN ENDOSCOPIC PROCEDURE TODAY AT THE [facility]: Refer to the procedure report that was given to you for any specific questions about what was found during the examination.  If the procedure report does not answer your questions, please call your gastroenterologist to clarify.  If you requested that your care partner not be given the details of your procedure findings, then the procedure report has been included in a sealed envelope for you to review at your convenience later.  YOU SHOULD EXPECT: Some feelings of bloating in the abdomen. Passage of more gas than usual.  Walking can help get rid of the air that was put into your GI tract during the procedure and reduce the bloating. If you had a lower endoscopy (such as a colonoscopy or flexible sigmoidoscopy) you may notice spotting of blood in your stool or on the toilet paper. If you underwent a bowel prep for your procedure, then you may not have a normal bowel movement for a few days.  DIET: Your first meal following the procedure should be a light meal and then it is ok to progress to your normal diet.  A half-sandwich or bowl of soup is an example of a good first meal.  Heavy or fried foods are harder to digest and may make you feel nauseous or bloated.  Likewise meals heavy in dairy and vegetables can cause extra gas to form and this can also increase the bloating.  Drink plenty of fluids but you should avoid alcoholic beverages for 24 hours.  ACTIVITY: Your care partner should take you home directly after the procedure.  You should plan to take it easy, moving slowly for the rest of the day.  You can resume normal activity the day after the procedure however you should NOT DRIVE or use heavy machinery for 24 hours (because of the sedation medicines used during the test).    SYMPTOMS TO REPORT  IMMEDIATELY: A gastroenterologist can be reached at any hour.  During normal business hours, 8:30 AM to 5:00 PM Monday through Friday, call [phone].  After hours and on weekends, please call the GI answering service at [phone] who will take a message and have the physician on call contact you.    Following upper endoscopy (EGD)  Vomiting of blood or coffee ground material  New chest pain or pain under the shoulder blades  Painful or persistently difficult swallowing  New shortness of breath  Fever of 100F or higher  Black, tarry-looking stools  FOLLOW UP: If any biopsies were taken you will be contacted by phone or by letter within the next 1-3 weeks.  Call your gastroenterologist if you have not heard about the biopsies in 3 weeks.  Our staff will call the home number listed on your records the next business day following your procedure to check on you and address any questions or concerns that you may have at that time regarding the information given to you following your procedure. This is a courtesy call and so if there is no answer at the home number and we have not heard from you through the emergency physician on call, we will assume that you have returned to your regular daily activities without incident.  SIGNATURES/CONFIDENTIALITY: You and/or your care partner have signed paperwork which will be entered into your electronic medical record.  These signatures attest to the fact that   that the information above on your After Visit Summary has been reviewed and is understood.  Full responsibility of the confidentiality of this discharge information lies with you and/or your care-partner. 

## 2011-12-28 NOTE — Progress Notes (Signed)
Patient did not experience any of the following events: a burn prior to discharge; a fall within the facility; wrong site/side/patient/procedure/implant event; or a hospital transfer or hospital admission upon discharge from the facility. (G8907) Patient did not have preoperative order for IV antibiotic SSI prophylaxis. (G8918)  

## 2011-12-31 ENCOUNTER — Telehealth: Payer: Self-pay | Admitting: *Deleted

## 2011-12-31 NOTE — Telephone Encounter (Signed)
  Follow up Call-  Call back number 12/28/2011 12/04/2011  Post procedure Call Back phone  # [phone]  Permission to leave phone message Yes Yes     No answer left message on answering machine to call us back if having problems or has any questions.

## 2012-01-04 ENCOUNTER — Encounter: Payer: Self-pay | Admitting: Gastroenterology

## 2012-04-01 NOTE — Progress Notes (Signed)
[facility] today.  Letter mailed to patient.  

## 2014-01-01 ENCOUNTER — Other Ambulatory Visit: Payer: Self-pay | Admitting: Nurse Practitioner

## 2014-05-26 DIAGNOSIS — Z1389 Encounter for screening for other disorder: Secondary | ICD-10-CM | POA: Diagnosis not present

## 2014-05-26 DIAGNOSIS — Z23 Encounter for immunization: Secondary | ICD-10-CM | POA: Diagnosis not present

## 2014-05-26 DIAGNOSIS — Z0000 Encounter for general adult medical examination without abnormal findings: Secondary | ICD-10-CM | POA: Diagnosis not present

## 2014-05-26 DIAGNOSIS — I10 Essential (primary) hypertension: Secondary | ICD-10-CM | POA: Diagnosis not present

## 2014-05-26 DIAGNOSIS — K219 Gastro-esophageal reflux disease without esophagitis: Secondary | ICD-10-CM | POA: Diagnosis not present

## 2014-11-25 DIAGNOSIS — I10 Essential (primary) hypertension: Secondary | ICD-10-CM | POA: Diagnosis not present

## 2014-11-25 DIAGNOSIS — E780 Pure hypercholesterolemia: Secondary | ICD-10-CM | POA: Diagnosis not present

## 2014-11-25 DIAGNOSIS — Z0000 Encounter for general adult medical examination without abnormal findings: Secondary | ICD-10-CM | POA: Diagnosis not present

## 2014-11-25 DIAGNOSIS — R7301 Impaired fasting glucose: Secondary | ICD-10-CM | POA: Diagnosis not present

## 2014-12-02 DIAGNOSIS — H6121 Impacted cerumen, right ear: Secondary | ICD-10-CM | POA: Diagnosis not present

## 2014-12-02 DIAGNOSIS — R0789 Other chest pain: Secondary | ICD-10-CM | POA: Diagnosis not present

## 2014-12-02 DIAGNOSIS — E780 Pure hypercholesterolemia: Secondary | ICD-10-CM | POA: Diagnosis not present

## 2014-12-02 DIAGNOSIS — K219 Gastro-esophageal reflux disease without esophagitis: Secondary | ICD-10-CM | POA: Diagnosis not present

## 2014-12-02 DIAGNOSIS — Z23 Encounter for immunization: Secondary | ICD-10-CM | POA: Diagnosis not present

## 2014-12-20 ENCOUNTER — Encounter: Payer: Self-pay | Admitting: Gastroenterology

## 2015-04-11 ENCOUNTER — Emergency Department (HOSPITAL_COMMUNITY)
Admission: EM | Admit: 2015-04-11 | Discharge: 2015-04-11 | Disposition: A | Discharge status: Home / Self Care | Payer: Medicare Other | Attending: Emergency Medicine | Admitting: Emergency Medicine

## 2015-04-11 ENCOUNTER — Emergency Department (HOSPITAL_COMMUNITY): Payer: Medicare Other

## 2015-04-11 ENCOUNTER — Encounter (HOSPITAL_COMMUNITY): Payer: Self-pay

## 2015-04-11 DIAGNOSIS — Z8639 Personal history of other endocrine, nutritional and metabolic disease: Secondary | ICD-10-CM | POA: Insufficient documentation

## 2015-04-11 DIAGNOSIS — Z792 Long term (current) use of antibiotics: Secondary | ICD-10-CM | POA: Diagnosis not present

## 2015-04-11 DIAGNOSIS — K529 Noninfective gastroenteritis and colitis, unspecified: Secondary | ICD-10-CM | POA: Insufficient documentation

## 2015-04-11 DIAGNOSIS — K625 Hemorrhage of anus and rectum: Secondary | ICD-10-CM | POA: Diagnosis not present

## 2015-04-11 DIAGNOSIS — Y9289 Other specified places as the place of occurrence of the external cause: Secondary | ICD-10-CM | POA: Insufficient documentation

## 2015-04-11 DIAGNOSIS — K921 Melena: Secondary | ICD-10-CM | POA: Diagnosis not present

## 2015-04-11 DIAGNOSIS — I10 Essential (primary) hypertension: Secondary | ICD-10-CM | POA: Diagnosis not present

## 2015-04-11 DIAGNOSIS — S8990XA Unspecified injury of unspecified lower leg, initial encounter: Secondary | ICD-10-CM | POA: Diagnosis not present

## 2015-04-11 DIAGNOSIS — K219 Gastro-esophageal reflux disease without esophagitis: Secondary | ICD-10-CM | POA: Insufficient documentation

## 2015-04-11 DIAGNOSIS — Z880 Allergy status to penicillin: Secondary | ICD-10-CM | POA: Diagnosis not present

## 2015-04-11 DIAGNOSIS — Y998 Other external cause status: Secondary | ICD-10-CM | POA: Diagnosis not present

## 2015-04-11 DIAGNOSIS — R42 Dizziness and giddiness: Secondary | ICD-10-CM | POA: Diagnosis not present

## 2015-04-11 DIAGNOSIS — M1990 Unspecified osteoarthritis, unspecified site: Secondary | ICD-10-CM | POA: Diagnosis not present

## 2015-04-11 DIAGNOSIS — R55 Syncope and collapse: Secondary | ICD-10-CM | POA: Diagnosis not present

## 2015-04-11 DIAGNOSIS — S098XXA Other specified injuries of head, initial encounter: Secondary | ICD-10-CM | POA: Diagnosis not present

## 2015-04-11 DIAGNOSIS — K922 Gastrointestinal hemorrhage, unspecified: Secondary | ICD-10-CM | POA: Diagnosis not present

## 2015-04-11 DIAGNOSIS — J45909 Unspecified asthma, uncomplicated: Secondary | ICD-10-CM | POA: Diagnosis not present

## 2015-04-11 DIAGNOSIS — S0990XA Unspecified injury of head, initial encounter: Secondary | ICD-10-CM

## 2015-04-11 DIAGNOSIS — W228XXA Striking against or struck by other objects, initial encounter: Secondary | ICD-10-CM | POA: Diagnosis not present

## 2015-04-11 DIAGNOSIS — Y9389 Activity, other specified: Secondary | ICD-10-CM | POA: Insufficient documentation

## 2015-04-11 DIAGNOSIS — S060X9A Concussion with loss of consciousness of unspecified duration, initial encounter: Secondary | ICD-10-CM | POA: Diagnosis not present

## 2015-04-11 DIAGNOSIS — Z853 Personal history of malignant neoplasm of breast: Secondary | ICD-10-CM | POA: Insufficient documentation

## 2015-04-11 DIAGNOSIS — Z79899 Other long term (current) drug therapy: Secondary | ICD-10-CM | POA: Insufficient documentation

## 2015-04-11 LAB — PROTIME-INR
INR: 0.95 (ref 0.00–1.49)
PROTHROMBIN TIME: 12.9 s (ref 11.6–15.2)

## 2015-04-11 LAB — POC OCCULT BLOOD, ED: FECAL OCCULT BLD: POSITIVE — AB

## 2015-04-11 LAB — CBC WITH DIFFERENTIAL/PLATELET
Basophils Absolute: 0 10*3/uL (ref 0.0–0.1)
Basophils Relative: 0 %
EOS PCT: 1 %
Eosinophils Absolute: 0.1 10*3/uL (ref 0.0–0.7)
HEMATOCRIT: 43.5 % (ref 36.0–46.0)
Hemoglobin: 14.8 g/dL (ref 12.0–15.0)
LYMPHS PCT: 31 %
Lymphs Abs: 2.8 10*3/uL (ref 0.7–4.0)
MCH: 29.2 pg (ref 26.0–34.0)
MCHC: 34 g/dL (ref 30.0–36.0)
MCV: 86 fL (ref 78.0–100.0)
MONO ABS: 0.7 10*3/uL (ref 0.1–1.0)
MONOS PCT: 8 %
NEUTROS ABS: 5.6 10*3/uL (ref 1.7–7.7)
Neutrophils Relative %: 60 %
Platelets: 395 10*3/uL (ref 150–400)
RBC: 5.06 MIL/uL (ref 3.87–5.11)
RDW: 14 % (ref 11.5–15.5)
WBC: 9.2 10*3/uL (ref 4.0–10.5)

## 2015-04-11 LAB — COMPREHENSIVE METABOLIC PANEL
ALBUMIN: 4.6 g/dL (ref 3.5–5.0)
ALT: 44 U/L (ref 14–54)
AST: 30 U/L (ref 15–41)
Alkaline Phosphatase: 97 U/L (ref 38–126)
Anion gap: 13 (ref 5–15)
BILIRUBIN TOTAL: 0.5 mg/dL (ref 0.3–1.2)
BUN: 21 mg/dL — AB (ref 6–20)
CHLORIDE: 95 mmol/L — AB (ref 101–111)
CO2: 29 mmol/L (ref 22–32)
CREATININE: 0.9 mg/dL (ref 0.44–1.00)
Calcium: 10 mg/dL (ref 8.9–10.3)
GFR calc Af Amer: >60 mL/min (ref >60)
GFR calc non Af Amer: >60 mL/min (ref >60)
GLUCOSE: 129 mg/dL — AB (ref 65–99)
POTASSIUM: 3.2 mmol/L — AB (ref 3.5–5.1)
Sodium: 137 mmol/L (ref 135–145)
TOTAL PROTEIN: 8.4 g/dL — AB (ref 6.5–8.1)

## 2015-04-11 LAB — URINALYSIS, ROUTINE W REFLEX MICROSCOPIC
Bilirubin Urine: NEGATIVE
GLUCOSE, UA: NEGATIVE mg/dL
Hgb urine dipstick: NEGATIVE
Ketones, ur: NEGATIVE mg/dL
LEUKOCYTES UA: NEGATIVE
NITRITE: NEGATIVE
PROTEIN: NEGATIVE mg/dL
Specific Gravity, Urine: 1.01 (ref 1.005–1.030)
pH: 5 (ref 5.0–8.0)

## 2015-04-11 LAB — TYPE AND SCREEN
ABO/RH(D): A POS
Antibody Screen: NEGATIVE

## 2015-04-11 LAB — LIPASE, BLOOD: Lipase: 39 U/L (ref 11–51)

## 2015-04-11 LAB — TROPONIN I: Troponin I: <0.03 ng/mL (ref <0.031)

## 2015-04-11 MED ORDER — IOHEXOL 300 MG/ML  SOLN
100.0000 mL | Freq: Once | INTRAMUSCULAR | Status: AC | PRN
  Administered 2015-04-11: 100 mL via INTRAVENOUS

## 2015-04-11 MED ORDER — METRONIDAZOLE 500 MG PO TABS
500.0000 mg | ORAL_TABLET | Freq: Once | ORAL | Status: AC
  Administered 2015-04-11: 500 mg via ORAL
  Filled 2015-04-11: qty 1

## 2015-04-11 MED ORDER — CIPROFLOXACIN HCL 500 MG PO TABS: 500.0000 mg | ORAL_TABLET | Freq: Two times a day (BID) | ORAL | Status: DC

## 2015-04-11 MED ORDER — CIPROFLOXACIN HCL 500 MG PO TABS
500.0000 mg | ORAL_TABLET | Freq: Once | ORAL | Status: AC
  Administered 2015-04-11: 500 mg via ORAL
  Filled 2015-04-11: qty 1

## 2015-04-11 MED ORDER — SODIUM CHLORIDE 0.9 % IV BOLUS (SEPSIS)
1000.0000 mL | Freq: Once | INTRAVENOUS | Status: AC
  Administered 2015-04-11: 1000 mL via INTRAVENOUS

## 2015-04-11 MED ORDER — METRONIDAZOLE 500 MG PO TABS: 500.0000 mg | ORAL_TABLET | Freq: Two times a day (BID) | ORAL | Status: DC

## 2015-04-11 NOTE — ED Notes (Signed)
Provided patient ice chips with permission from provider.  

## 2015-04-11 NOTE — ED Notes (Signed)
Pt in CT.

## 2015-04-11 NOTE — ED Provider Notes (Signed)
CSN: [id number]     Arrival date & time 04/11/15  1400 History   First MD Initiated Contact with Patient 04/11/15 1725     Chief Complaint  Patient presents with  . Headache  . Loss of Consciousness     (Consider location/radiation/quality/duration/timing/severity/associated sxs/prior Treatment) HPI The last weekend of January the patient broke a tooth. She went to see the dentist Monday and had the tooth pulled. She started antibiotics. On Saturday she developed cramping lower abdominal pain and urgency for defecation. She sat on the toilet and tried to stand up and got very lightheaded and pale and passed out. She reports that she hit her head and her knee and her back at that time. She reports then she also began vomiting. Once that episode had passed, she went back to bed. Sunday she continued to have cramping and passed a bowel movement that became bloody and also had clots in it. No vomiting beyond the first episode. Today she is still having some lower abdominal cramping but has not had bloody bowel movements. She went to see her family physician and was found to have low blood pressure. She was sent to the emergency department for further evaluation. The patient reports that one of the concerns was to get a CT scan to rule out concussion. She has not had any ongoing confusion or incoordination. She does reports she's felt somewhat weak. No blurred vision or double vision. Past Medical History  Diagnosis Date  . Barrett's esophagus   . GERD (gastroesophageal reflux disease)   . Hiatal hernia   . Fundic gland polyps of stomach, benign   . Hypertension   . Allergy     SEASONAL  . Arthritis     THUMB  . Asthma   . Hyperlipidemia   . Breast cancer ([place])     both breasts  . Endometrioid adenocarcinoma    Past Surgical History  Procedure Laterality Date  . Mastectomy    . Endometrial biopsy    . Total abdominal hysterectomy    . Breast reconstruction    . Eye surgery    . Upper  gastrointestinal endoscopy    . Colonoscopy     Family History  Problem Relation Age of Onset  . Brain cancer Maternal Uncle   . Breast cancer Mother   . Breast cancer Maternal Aunt   . Colon cancer Neg Hx   . Esophageal cancer Neg Hx   . Stomach cancer Neg Hx   . Rectal cancer Neg Hx   . Irritable bowel syndrome Maternal Grandfather    Social History  Substance Use Topics  . Smoking status: Never Smoker   . Smokeless tobacco: Never Used  . Alcohol Use: Yes     Comment: rarely   OB History    No data available     Review of Systems 10 Systems reviewed and are negative for acute change except as noted in the HPI.    Allergies  Citrus; Lactose intolerance (gi); Lipitor; and Penicillins  Home Medications   Prior to Admission medications   Medication Sig Start Date End Date Taking? Authorizing Provider  acetaminophen (TYLENOL) 500 MG tablet Take 1,000 mg by mouth every 6 (six) hours as needed for moderate pain or headache.   Yes Historical Provider, MD  CHROMIUM PO Take 1 tablet by mouth daily.   Yes Historical Provider, MD  lisinopril-hydrochlorothiazide (PRINZIDE,ZESTORETIC) 20-12.5 MG per tablet Take 1 tablet by mouth daily.   Yes Historical Provider, MD  pantoprazole (PROTONIX) 40 MG tablet Take 40 mg by mouth daily.   Yes Historical Provider, MD  ciprofloxacin (CIPRO) 500 MG tablet Take 1 tablet (500 mg total) by mouth 2 (two) times daily. One po bid x 7 days 04/11/15   Charlesetta Shanks, MD  clindamycin (CLEOCIN) 300 MG capsule Take 300 mg by mouth 3 (three) times daily.    Historical Provider, MD  metroNIDAZOLE (FLAGYL) 500 MG tablet Take 1 tablet (500 mg total) by mouth 2 (two) times daily. One po bid x 7 days 04/11/15   Charlesetta Shanks, MD   BP 115/79 mmHg  Pulse 91  Temp(Src) 98.6 F (37 C) (Oral)  Resp 18  SpO2 99% Physical Exam  Constitutional: She is oriented to person, place, and time. She appears well-developed and well-nourished.  HENT:  Head: Normocephalic  and atraumatic.  Eyes: EOM are normal. Pupils are equal, round, and reactive to light.  Neck: Neck supple.  Cardiovascular: Normal rate, regular rhythm, normal heart sounds and intact distal pulses.   Pulmonary/Chest: Effort normal and breath sounds normal.  Abdominal: Soft. Bowel sounds are normal. She exhibits no distension. There is tenderness.  Bilateral lower abdominal tenderness palpation without guarding.  Genitourinary:  Rectal examination: Small red clot and blood tinged mucus in the rectal vault.  Musculoskeletal: Normal range of motion. She exhibits no edema or tenderness.  Neurological: She is alert and oriented to person, place, and time. She has normal strength. No cranial nerve deficit. She exhibits normal muscle tone. Coordination normal. GCS eye subscore is 4. GCS verbal subscore is 5. GCS motor subscore is 6.  Skin: Skin is warm, dry and intact.  Psychiatric: She has a normal mood and affect.    ED Course  Procedures (including critical care time) Labs Review Labs Reviewed  COMPREHENSIVE METABOLIC PANEL - Abnormal; Notable for the following:    Potassium 3.2 (*)    Chloride 95 (*)    Glucose, Bld 129 (*)    BUN 21 (*)    Total Protein 8.4 (*)    All other components within normal limits  POC OCCULT BLOOD, ED - Abnormal; Notable for the following:    Fecal Occult Bld POSITIVE (*)    All other components within normal limits  LIPASE, BLOOD  CBC WITH DIFFERENTIAL/PLATELET  TROPONIN I  PROTIME-INR  URINALYSIS, ROUTINE W REFLEX MICROSCOPIC (NOT AT [facility])  TYPE AND SCREEN    Imaging Review Ct Head Wo Contrast  04/11/2015  CLINICAL DATA:  Acute onset of headache and dizziness. Recent syncopal episode. Rectal bleeding. Initial encounter. EXAM: CT HEAD WITHOUT CONTRAST TECHNIQUE: Contiguous axial images were obtained from the base of the skull through the vertex without intravenous contrast. COMPARISON:  PET/CT performed 06/22/2010 FINDINGS: There is no evidence of acute  infarction, mass lesion, or intra- or extra-axial hemorrhage on CT. The posterior fossa, including the cerebellum, brainstem and fourth ventricle, is within normal limits. The third and lateral ventricles, and basal ganglia are unremarkable in appearance. The cerebral hemispheres are symmetric in appearance, with normal gray-white differentiation. No mass effect or midline shift is seen. There is no evidence of fracture; visualized osseous structures are unremarkable in appearance. The orbits are within normal limits. The paranasal sinuses and mastoid air cells are well-aerated. No significant soft tissue abnormalities are seen. IMPRESSION: Unremarkable noncontrast CT of the head. Electronically Signed   By: Garald Balding M.D.   On: 04/11/2015 21:15   Ct Abdomen Pelvis W Contrast  04/11/2015  CLINICAL DATA:  Headache,  dizziness, and shoulder pain after a syncopal episode this weekend and rectal bleed x 2 days. Pt reported blood clots in multiple bowel movements. Breast cancer. EXAM: CT ABDOMEN AND PELVIS WITH CONTRAST TECHNIQUE: Multidetector CT imaging of the abdomen and pelvis was performed using the standard protocol following bolus administration of intravenous contrast. CONTRAST:  145mL OMNIPAQUE IOHEXOL 300 MG/ML  SOLN COMPARISON:  PET scan 06/22/2010. FINDINGS: BODY WALL: BILATERAL breast implants. LOWER CHEST: Unremarkable.  Sliding-type hiatal hernia. ABDOMEN/PELVIS: Liver: No focal abnormality. Biliary: No evidence of biliary obstruction or stone. Pancreas: Unremarkable. Spleen: Unremarkable. Adrenals: Unremarkable. Kidneys and ureters: No hydronephrosis or stone. Bladder: Unremarkable. Reproductive: Total abdominal hysterectomy. Bowel: No obstruction. No appendiceal inflammation. There is bowel wall thickening involving the descending colon over an 8-9 cm segment. Slight pericolonic stranding without signs of perforation. Findings are consistent with a nonspecific infectious or inflammatory colitis.  Neoplasm is less favored given the length of involvement. Retroperitoneum: No mass or adenopathy. Peritoneum: No free fluid or gas. Vascular: No acute abnormality. OSSEOUS: No acute abnormalities. IMPRESSION: Suspected infectious or inflammatory colitis involving the descending colon. Slight pericolonic stranding of the fat without frank perforation. Electronically Signed   By: Staci Righter M.D.   On: 04/11/2015 21:20   I have personally reviewed and evaluated these images and lab results as part of my medical decision-making.   EKG Interpretation None      MDM   Final diagnoses:  Colitis  Syncope and collapse  Head injury, initial encounter   Patient was sent from her family [facility] office today. One concern was for a syncopal episode 2 days ago. The patient had struck her head when she fell in his has some headache since. She has no neurologic deficit on examination and her mental status is clear. CT head shows no intracranial injury. At this point I feel she is clear from perspective of significant head injury. Patient has some mild postconcussive symptoms of ongoing headache and mild dizziness.  Patient had GI cramping and rectal bleeding that precipitated a vasovagal syncopal episode. She continued to have cramping and GI bleeding. Her abdominal examination is nonsurgical. CT does show approximately 10 cm of colitis. This is consistent with her bleeding. H&H are stable and vital signs are stable. At this time I do feel she is appropriate for discharge with outpatient treatment of colitis with Flagyl and Cipro. Patient denies need for additional pain medications. She is given signs and symptoms for which return and instructed to follow-up with her doctor this week for recheck.    Charlesetta Shanks, MD 04/11/15 2145

## 2015-04-11 NOTE — Discharge Instructions (Signed)
Colitis Colitis is inflammation of the colon. Colitis may last a short time (acute) or it may last a long time (chronic). CAUSES This condition may be caused by:  Viruses.  Bacteria.  Reactions to medicine.  Certain autoimmune diseases, such as Crohn disease or ulcerative colitis. SYMPTOMS Symptoms of this condition include:  Diarrhea.  Passing bloody or tarry stool.  Pain.  Fever.  Vomiting.  Tiredness (fatigue).  Weight loss.  Bloating.  Sudden increase in abdominal pain.  Having fewer bowel movements than usual. DIAGNOSIS This condition is diagnosed with a stool test or a blood test. You may also have other tests, including X-rays, a CT scan, or a colonoscopy. TREATMENT Treatment may include:  Resting the bowel. This involves not eating or drinking for a period of time.  Fluids that are given through an IV tube.  Medicine for pain and diarrhea.  Antibiotic medicines.  Cortisone medicines.  Surgery. HOME CARE INSTRUCTIONS Eating and Drinking  Follow instructions from your health care provider about eating or drinking restrictions.  Drink enough fluid to keep your urine clear or pale yellow.  Work with a dietitian to determine which foods cause your condition to flare up.  Avoid foods that cause flare-ups.  Eat a well-balanced diet. Medicines  Take over-the-counter and prescription medicines only as told by your health care provider.  If you were prescribed an antibiotic medicine, take it as told by your health care provider. Do not stop taking the antibiotic even if you start to feel better. General Instructions  Keep all follow-up visits as told by your health care provider. This is important. SEEK MEDICAL CARE IF:  Your symptoms do not go away.  You develop new symptoms. SEEK IMMEDIATE MEDICAL CARE IF:  You have a fever that does not go away with treatment.  You develop chills.  You have extreme weakness, fainting, or  dehydration.  You have repeated vomiting.  You develop severe pain in your abdomen.  You pass bloody or tarry stool.   This information is not intended to replace advice given to you by your health care provider. Make sure you discuss any questions you have with your health care provider.   Document Released: 03/29/2004 Document Revised: 11/10/2014 Document Reviewed: 06/14/2014 Elsevier Interactive Patient Education 2016 [employer].   Syncope, commonly known as fainting, is a temporary loss of consciousness. It occurs when the blood flow to the brain is reduced. Vasovagal syncope (also called neurocardiogenic syncope) is a fainting spell in which the blood flow to the brain is reduced because of a sudden drop in heart rate and blood pressure. Vasovagal syncope occurs when the brain and the cardiovascular system (blood vessels) do not adequately communicate and respond to each other. This is the most common cause of fainting. It often occurs in response to fear or some other type of emotional or physical stress. The body has a reaction in which the heart starts beating too slowly or the blood vessels expand, reducing blood pressure. This type of fainting spell is generally considered harmless. However, injuries can occur if a person takes a sudden fall during a fainting spell.  CAUSES  Vasovagal syncope occurs when a person's blood pressure and heart rate decrease suddenly, usually in response to a trigger. Many things and situations can trigger an episode. Some of these include:   Pain.   Fear.   The sight of blood or medical procedures, such as blood being drawn from a vein.   Common activities, such as  coughing, swallowing, stretching, or going to the bathroom.   Emotional stress.   Prolonged standing, especially in a warm environment.   Lack of sleep or rest.   Prolonged lack of food.   Prolonged lack of fluids.   Recent illness.  The use of certain drugs that  affect blood pressure, such as cocaine, alcohol, marijuana, inhalants, and opiates.  SYMPTOMS  Before the fainting episode, you may:   Feel dizzy or light headed.   Become pale.  Sense that you are going to faint.   Feel like the room is spinning.   Have tunnel vision, only seeing directly in front of you.   Feel sick to your stomach (nauseous).   See spots or slowly lose vision.   Hear ringing in your ears.   Have a headache.   Feel warm and sweaty.   Feel a sensation of pins and needles. During the fainting spell, you will generally be unconscious for no longer than a couple minutes before waking up and returning to normal. If you get up too quickly before your body can recover, you may faint again. Some twitching or jerky movements may occur during the fainting spell.  DIAGNOSIS  Your health care provider will ask about your symptoms, take a medical history, and perform a physical exam. Various tests may be done to rule out other causes of fainting. These may include blood tests and tests to check the heart, such as electrocardiography, echocardiography, and possibly an electrophysiology study. When other causes have been ruled out, a test may be done to check the body's response to changes in position (tilt table test). TREATMENT  Most cases of vasovagal syncope do not require treatment. Your health care provider may recommend ways to avoid fainting triggers and may provide home strategies for preventing fainting. If you must be exposed to a possible trigger, you can drink additional fluids to help reduce your chances of having an episode of vasovagal syncope. If you have warning signs of an oncoming episode, you can respond by positioning yourself favorably (lying down). If your fainting spells continue, you may be given medicines to prevent fainting. Some medicines may help make you more resistant to repeated episodes of vasovagal syncope. Special exercises or  compression stockings may be recommended. In rare cases, the surgical placement of a pacemaker is considered. HOME CARE INSTRUCTIONS   Learn to identify the warning signs of vasovagal syncope.   Sit or lie down at the first warning sign of a fainting spell. If sitting, put your head down between your legs. If you lie down, swing your legs up in the air to increase blood flow to the brain.   Avoid hot tubs and saunas.  Avoid prolonged standing.  Drink enough fluids to keep your urine clear or pale yellow. Avoid caffeine.  Increase salt in your diet as directed by your health care provider.   If you have to stand for a long time, perform movements such as:   Crossing your legs.   Flexing and stretching your leg muscles.   Squatting.   Moving your legs.   Bending over.   Only take over-the-counter or prescription medicines as directed by your health care provider. Do not suddenly stop any medicines without asking your health care provider first. [place] IF:   Your fainting spells continue or happen more frequently in spite of treatment.   You lose consciousness for more than a couple minutes.  You have fainting spells during or after  exercising or after being startled.   You have new symptoms that occur with the fainting spells, such as:   Shortness of breath.  Chest pain.   Irregular heartbeat.   You have episodes of twitching or jerky movements that last longer than a few seconds.  You have episodes of twitching or jerky movements without obvious fainting. SEEK IMMEDIATE MEDICAL CARE IF:   You have injuries or bleeding after a fainting spell.   You have episodes of twitching or jerky movements that last longer than 5 minutes.   You have more than one spell of twitching or jerky movements before returning to consciousness after fainting.   This information is not intended to replace advice given to you by your health care provider. Make  sure you discuss any questions you have with your health care provider.   Document Released: 02/06/2012 Document Revised: 07/06/2014 Document Reviewed: 02/06/2012 Elsevier Interactive Patient Education [employer].

## 2015-04-11 NOTE — ED Notes (Signed)
Pt had tooth pulled last Monday.  Had been on antibiotics.  Pt states Saturday, she had syncopal episode that she thought it was related to antibiotics.  Pt did not seek MD.  Has had constant headache since.  Seen by MD today and told to come here for CT.  Neuro WNL.  Some nausea.

## 2015-04-11 NOTE — ED Notes (Signed)
Pt stated unable to give urine sample at this time 

## 2015-04-11 NOTE — ED Notes (Signed)
Pt, being sent by PCP, c/o headache, dizziness, and shoulder pain after a syncopal episode this weekend and rectal bleed x 2 days.  Pt reported blood clots in multiple bowel movements.

## 2015-04-19 DIAGNOSIS — R55 Syncope and collapse: Secondary | ICD-10-CM | POA: Diagnosis not present

## 2015-04-21 ENCOUNTER — Telehealth: Payer: Self-pay | Admitting: Cardiology

## 2015-04-21 NOTE — Telephone Encounter (Signed)
Received records from [facility] for appointment on 04/25/15 with Dr Percival Spanish.  Records given to [facility] (medical records) for Dr Hochrein's schedule on 04/25/15. lp

## 2015-04-24 NOTE — Progress Notes (Signed)
Cardiology Office Note   Date:  04/25/2015   ID:  [name], DOB Jan 18, 1951, MRN [id number]  PCP:  Jani Gravel, MD  Cardiologist:   Minus Breeding, MD   Chief Complaint  Patient presents with  . Loss of Consciousness      History of Present Illness: [name] is a 65 y.o. female who presents for evaluation of syncope.  She has no past cardiac history.  She has had a difficult time recently with her mother dying. She had a choking spell at the funeral. She broke a tooth on a cough drop. She subsequently had antibiotics for this and developed she thought was an allergic reaction. She subsequently had a syncopal episode one night around this time when she got up went to the bathroom and felt very nauseated and had frank syncope. She went to the emergency room and was actually found to have colitis. She never had syncope before this and none since. Of note her antihypertensive has been reduced to half its previous dose. Since that time she has been weak and unable to do her usual walking although she is just starting to get back into this. She rarely feels her heart racing but this seems to be more chronic. She does not have any resting shortness of breath, PND or orthopnea. She has no palpitations, presyncope or syncope. She has no chest pressure, neck or arm discomfort. Of note she went to the emergency room and I did review these records.   Past Medical History  Diagnosis Date  . Barrett's esophagus   . GERD (gastroesophageal reflux disease)   . Hiatal hernia   . Fundic gland polyps of stomach, benign   . Hypertension   . Allergy     SEASONAL  . Arthritis     THUMB  . Asthma   . Hyperlipidemia   . Breast cancer ([place])     both breasts  . Endometrioid adenocarcinoma     Past Surgical History  Procedure Laterality Date  . Mastectomy    . Endometrial biopsy    . Total abdominal hysterectomy    . Breast reconstruction    . Eye surgery    . Upper  gastrointestinal endoscopy    . Colonoscopy       Current Outpatient Prescriptions  Medication Sig Dispense Refill  . acetaminophen (TYLENOL) 500 MG tablet Take 1,000 mg by mouth every 6 (six) hours as needed for moderate pain or headache.    . lisinopril-hydrochlorothiazide (PRINZIDE,ZESTORETIC) 20-12.5 MG per tablet Take 0.5 tablets by mouth daily.     . pantoprazole (PROTONIX) 40 MG tablet Take 40 mg by mouth daily.     No current facility-administered medications for this visit.    Allergies:   Citrus; Lactose intolerance (gi); Lipitor; and Penicillins    Social History:  The patient  reports that she has never smoked. She has never used smokeless tobacco. She reports that she drinks alcohol. She reports that she does not use illicit drugs.   Family History:  The patient's family history includes Brain cancer in her maternal uncle; Breast cancer in her maternal aunt and mother; Irritable bowel syndrome in her maternal grandfather; Stroke in her maternal grandmother. There is no history of Colon cancer, Esophageal cancer, Stomach cancer, or Rectal cancer.    ROS:  Please see the history of present illness.   Otherwise, review of systems are positive for none.   All other systems are reviewed and negative.  PHYSICAL EXAM: VS:  BP 115/75 mmHg  Pulse 79  Ht 5\' 4"  (1.626 m)  Wt 174 lb (78.926 kg)  BMI 29.85 kg/m2 , BMI Body mass index is 29.85 kg/(m^2). GENERAL:  Well appearing HEENT:  Pupils equal round and reactive, fundi not visualized, oral mucosa unremarkable NECK:  No jugular venous distention, waveform within normal limits, carotid upstroke brisk and symmetric, no bruits, no thyromegaly LYMPHATICS:  No cervical, inguinal adenopathy LUNGS:  Clear to auscultation bilaterally BACK:  No CVA tenderness CHEST:  Status post reconstructive breast surgery. HEART:  PMI not displaced or sustained,S1 and S2 within normal limits, no S3, no S4, no clicks, no rubs, no murmurs ABD:   Flat, positive bowel sounds normal in frequency in pitch, no bruits, no rebound, no guarding, no midline pulsatile mass, no hepatomegaly, no splenomegaly EXT:  2 plus pulses throughout, no edema, no cyanosis no clubbing SKIN:  No rashes no nodules NEURO:  Cranial nerves II through XII grossly intact, motor grossly intact throughout PSYCH:  Cognitively intact, oriented to person place and time    EKG:  EKG is ordered today. The ekg ordered today demonstrates sinus rhythm, right bundle branch block, rate 78, axis within normal limits, intervals within normal limits, no acute ST-T wave changes.   Recent Labs: 04/11/2015: ALT 44; BUN 21*; Creatinine, Ser 0.90; Hemoglobin 14.8; Platelets 395; Potassium 3.2*; Sodium 137    Lipid Panel No results found for: CHOL, TRIG, HDL, CHOLHDL, VLDL, LDLCALC, LDLDIRECT    Wt Readings from Last 3 Encounters:  04/25/15 174 lb (78.926 kg)  12/28/11 177 lb (80.287 kg)  12/21/11 177 lb (80.287 kg)      Other studies Reviewed: Additional studies/ records that were reviewed today include: ED records and office records. Review of the above records demonstrates:  Please see elsewhere in the note.     ASSESSMENT AND PLAN:  SYNCOPE:  She was not orthostatic in the office today. I suspect that her event was vagal related to the colitis that was ongoing and the other issues. At this point I would not suspect that further cardiac evaluation would be at high yield as she has no other history or symptoms. However, should she continue to have weakness or any further syncopal episodes I would absolutely want see her back to discuss.  RBBB: I went back to EKG from 2012. Is unchanged and not a new finding.    Current medicines are reviewed at length with the patient today.  The patient does not have concerns regarding medicines.  The following changes have been made:  no change  Labs/ tests ordered today include: None  No orders of the defined types were placed  in this encounter.     Disposition:   FU with me as needed    Signed, Minus Breeding, MD  04/25/2015 9:54 AM    [facility]

## 2015-04-25 ENCOUNTER — Ambulatory Visit (INDEPENDENT_AMBULATORY_CARE_PROVIDER_SITE_OTHER): Payer: Medicare Other | Admitting: Cardiology

## 2015-04-25 ENCOUNTER — Encounter: Payer: Self-pay | Admitting: Cardiology

## 2015-04-25 VITALS — BP 115/75 | HR 79 | Ht 64.0 in | Wt 174.0 lb

## 2015-04-25 DIAGNOSIS — R55 Syncope and collapse: Secondary | ICD-10-CM

## 2015-04-25 NOTE — Patient Instructions (Signed)
Your physician wants you to follow-up in: 6 MONTHS WITH DR HOCHREIN You will receive a reminder letter in the mail two months in advance. If you don't receive a letter, please call our office to schedule the follow-up appointment.   If you need a refill on your cardiac medications before your next appointment, please call your pharmacy.  

## 2015-05-04 NOTE — Addendum Note (Signed)
Addended by: Diana Eves on: 05/04/2015 11:30 AM   Modules accepted: Orders

## 2015-05-18 DIAGNOSIS — I10 Essential (primary) hypertension: Secondary | ICD-10-CM | POA: Diagnosis not present

## 2015-05-18 DIAGNOSIS — K219 Gastro-esophageal reflux disease without esophagitis: Secondary | ICD-10-CM | POA: Diagnosis not present

## 2015-05-18 DIAGNOSIS — Z1389 Encounter for screening for other disorder: Secondary | ICD-10-CM | POA: Diagnosis not present

## 2015-05-18 DIAGNOSIS — R739 Hyperglycemia, unspecified: Secondary | ICD-10-CM | POA: Diagnosis not present

## 2015-06-14 DIAGNOSIS — R739 Hyperglycemia, unspecified: Secondary | ICD-10-CM | POA: Diagnosis not present

## 2015-06-14 DIAGNOSIS — I10 Essential (primary) hypertension: Secondary | ICD-10-CM | POA: Diagnosis not present

## 2015-06-21 DIAGNOSIS — K219 Gastro-esophageal reflux disease without esophagitis: Secondary | ICD-10-CM | POA: Diagnosis not present

## 2015-06-21 DIAGNOSIS — I10 Essential (primary) hypertension: Secondary | ICD-10-CM | POA: Diagnosis not present

## 2015-06-21 DIAGNOSIS — M545 Low back pain: Secondary | ICD-10-CM | POA: Diagnosis not present

## 2015-06-21 DIAGNOSIS — M25552 Pain in left hip: Secondary | ICD-10-CM | POA: Diagnosis not present

## 2015-07-20 ENCOUNTER — Ambulatory Visit: Payer: Medicare Other | Admitting: Gastroenterology

## 2015-12-22 DIAGNOSIS — Z23 Encounter for immunization: Secondary | ICD-10-CM | POA: Diagnosis not present

## 2016-01-19 DIAGNOSIS — Z23 Encounter for immunization: Secondary | ICD-10-CM | POA: Diagnosis not present

## 2016-07-17 DIAGNOSIS — R739 Hyperglycemia, unspecified: Secondary | ICD-10-CM | POA: Diagnosis not present

## 2016-07-17 DIAGNOSIS — Z0000 Encounter for general adult medical examination without abnormal findings: Secondary | ICD-10-CM | POA: Diagnosis not present

## 2016-07-17 DIAGNOSIS — I10 Essential (primary) hypertension: Secondary | ICD-10-CM | POA: Diagnosis not present

## 2016-07-24 DIAGNOSIS — R739 Hyperglycemia, unspecified: Secondary | ICD-10-CM | POA: Diagnosis not present

## 2016-07-24 DIAGNOSIS — I10 Essential (primary) hypertension: Secondary | ICD-10-CM | POA: Diagnosis not present

## 2016-08-01 DIAGNOSIS — K219 Gastro-esophageal reflux disease without esophagitis: Secondary | ICD-10-CM | POA: Diagnosis not present

## 2016-08-01 DIAGNOSIS — I10 Essential (primary) hypertension: Secondary | ICD-10-CM | POA: Diagnosis not present

## 2016-08-01 DIAGNOSIS — R7303 Prediabetes: Secondary | ICD-10-CM | POA: Diagnosis not present

## 2016-11-08 ENCOUNTER — Emergency Department (HOSPITAL_COMMUNITY): Payer: Medicare Other

## 2016-11-08 ENCOUNTER — Emergency Department (HOSPITAL_COMMUNITY)
Admission: EM | Admit: 2016-11-08 | Discharge: 2016-11-09 | Disposition: A | Discharge status: Home / Self Care | Payer: Medicare Other | Attending: Emergency Medicine | Admitting: Emergency Medicine

## 2016-11-08 ENCOUNTER — Encounter (HOSPITAL_COMMUNITY): Payer: Self-pay | Admitting: Emergency Medicine

## 2016-11-08 DIAGNOSIS — I10 Essential (primary) hypertension: Secondary | ICD-10-CM | POA: Insufficient documentation

## 2016-11-08 DIAGNOSIS — R079 Chest pain, unspecified: Secondary | ICD-10-CM

## 2016-11-08 DIAGNOSIS — J45909 Unspecified asthma, uncomplicated: Secondary | ICD-10-CM | POA: Diagnosis not present

## 2016-11-08 DIAGNOSIS — Z7982 Long term (current) use of aspirin: Secondary | ICD-10-CM | POA: Insufficient documentation

## 2016-11-08 DIAGNOSIS — Z79899 Other long term (current) drug therapy: Secondary | ICD-10-CM | POA: Diagnosis not present

## 2016-11-08 DIAGNOSIS — Z853 Personal history of malignant neoplasm of breast: Secondary | ICD-10-CM | POA: Diagnosis not present

## 2016-11-08 DIAGNOSIS — R0789 Other chest pain: Secondary | ICD-10-CM | POA: Diagnosis not present

## 2016-11-08 DIAGNOSIS — R072 Precordial pain: Secondary | ICD-10-CM | POA: Diagnosis not present

## 2016-11-08 LAB — CBC
HEMATOCRIT: 42.6 % (ref 36.0–46.0)
Hemoglobin: 14.8 g/dL (ref 12.0–15.0)
MCH: 29.7 pg (ref 26.0–34.0)
MCHC: 34.7 g/dL (ref 30.0–36.0)
MCV: 85.4 fL (ref 78.0–100.0)
Platelets: 339 10*3/uL (ref 150–400)
RBC: 4.99 MIL/uL (ref 3.87–5.11)
RDW: 13.5 % (ref 11.5–15.5)
WBC: 10.5 10*3/uL (ref 4.0–10.5)

## 2016-11-08 LAB — BASIC METABOLIC PANEL
Anion gap: 12 (ref 5–15)
BUN: 15 mg/dL (ref 6–20)
CHLORIDE: 98 mmol/L — AB (ref 101–111)
CO2: 28 mmol/L (ref 22–32)
Calcium: 9.9 mg/dL (ref 8.9–10.3)
Creatinine, Ser: 0.95 mg/dL (ref 0.44–1.00)
GFR calc non Af Amer: >60 mL/min (ref >60)
GLUCOSE: 150 mg/dL — AB (ref 65–99)
POTASSIUM: 4.3 mmol/L (ref 3.5–5.1)
SODIUM: 138 mmol/L (ref 135–145)

## 2016-11-08 LAB — POCT I-STAT TROPONIN I: Troponin i, poc: 0 ng/mL (ref 0.00–0.08)

## 2016-11-08 NOTE — ED Triage Notes (Signed)
Pt reports she began to have posterior rib pain that radiated into central chest an hour ago. Some SOB

## 2016-11-09 LAB — POCT I-STAT TROPONIN I: Troponin i, poc: 0 ng/mL (ref 0.00–0.08)

## 2016-11-09 LAB — D-DIMER, QUANTITATIVE (NOT AT ARMC): D DIMER QUANT: 0.36 ug{FEU}/mL (ref 0.00–0.50)

## 2016-11-09 MED ORDER — NAPROXEN 375 MG PO TABS: 375.0000 mg | ORAL_TABLET | Freq: Two times a day (BID) | ORAL | 0 refills | Status: DC

## 2016-11-09 MED ORDER — NAPROXEN 500 MG PO TABS
500.0000 mg | ORAL_TABLET | Freq: Once | ORAL | Status: AC
  Administered 2016-11-09: 500 mg via ORAL

## 2016-11-09 MED ORDER — NAPROXEN 500 MG PO TABS
ORAL_TABLET | ORAL | Status: AC
  Filled 2016-11-09: qty 1

## 2016-11-09 NOTE — ED Provider Notes (Signed)
[facility] DEPT Provider Note   CSN: [id number] Arrival date & time: 11/08/16  1746     History   Chief Complaint Chief Complaint  Patient presents with  . Chest Pain    HPI [name] is a 66 y.o. female.  HPI Pt comes in with cc of chest pain. Pt has hx of Barrett's esophagus, remote breast CA hx, GERD. She reports that around 5 pm she had sudden onset severe postero-inferior chest pain - as if her ribs were being broken. The pain was constant and then it radiates towards her chest - so she decided to come to the Er. Pt also feels like her chest is sore to touch and palpation. Pt denies associated nausea, diaphoresis, she thinks she might have had some dib when the chest pain was severe. Pt has been in the ER for > 6 hours when I saw her, and she reports that her pain has subsided now and she has minimal discomfort.  Pt has no hx of PE, DVT and denies any exogenous hormone (testosterone / estrogen) use, long distance travels or surgery in the past 6 weeks, active cancer, recent immobilization. Pt has no hx of CAD.   Past Medical History:  Diagnosis Date  . Allergy    SEASONAL  . Arthritis    THUMB  . Asthma   . Barrett's esophagus   . Breast cancer ([place])    both breasts  . Endometrioid adenocarcinoma   . Fundic gland polyps of stomach, benign   . GERD (gastroesophageal reflux disease)   . Hiatal hernia   . Hyperlipidemia   . Hypertension     Patient Active Problem List   Diagnosis Date Noted  . Syncope 04/25/2015  . Malignant neoplasm of lower-outer quadrant of female breast ([place]) 01/29/2011    Past Surgical History:  Procedure Laterality Date  . BREAST RECONSTRUCTION    . COLONOSCOPY    . ENDOMETRIAL BIOPSY    . EYE SURGERY    . MASTECTOMY    . TOTAL ABDOMINAL HYSTERECTOMY    . UPPER GASTROINTESTINAL ENDOSCOPY      OB History    No data available       Home Medications    Prior to Admission medications   Medication Sig Start Date End  Date Taking? Authorizing Provider  acetaminophen (TYLENOL) 500 MG tablet Take 1,000 mg by mouth every 6 (six) hours as needed for moderate pain or headache.   Yes [provider]  aspirin 325 MG tablet Take 325 mg by mouth once.   Yes [provider]  lisinopril-hydrochlorothiazide (PRINZIDE,ZESTORETIC) 20-12.5 MG per tablet Take 0.5 tablets by mouth daily.    Yes [provider]  pantoprazole (PROTONIX) 40 MG tablet Take 40 mg by mouth daily.   Yes [provider]  naproxen (NAPROSYN) 375 MG tablet Take 1 tablet (375 mg total) by mouth 2 (two) times daily. 11/09/16   Varney Biles, MD    Family History Family History  Problem Relation Age of Onset  . Brain cancer Maternal Uncle   . Breast cancer Mother   . Breast cancer Maternal Aunt   . Colon cancer Neg Hx   . Esophageal cancer Neg Hx   . Stomach cancer Neg Hx   . Rectal cancer Neg Hx   . Irritable bowel syndrome Maternal Grandfather   . Stroke Maternal Grandmother     Social History Social History  Substance Use Topics  . Smoking status: Never Smoker  . Smokeless  tobacco: Never Used  . Alcohol use Yes     Comment: rarely     Allergies   Citrus; Lactose intolerance (gi); Lipitor [atorvastatin]; and Penicillins   Review of Systems Review of Systems  Respiratory: Positive for chest tightness.   Cardiovascular: Positive for chest pain.  All other systems reviewed and are negative.    Physical Exam Updated Vital Signs BP 110/63   Pulse 62   Temp 98 F (36.7 C) (Oral)   Resp 11   SpO2 97%   Physical Exam  Constitutional: She is oriented to person, place, and time. She appears well-developed.  HENT:  Head: Normocephalic and atraumatic.  Eyes: Pupils are equal, round, and reactive to light. EOM are normal.  Cardiovascular: Normal rate, regular rhythm, normal heart sounds and intact distal pulses.   Pulmonary/Chest: Effort normal and breath sounds normal. No respiratory  distress. She has no wheezes.  Abdominal: Soft. Bowel sounds are normal. She exhibits no distension. There is no tenderness.  Neurological: She is alert and oriented to person, place, and time.  Skin: Skin is warm and dry.  Nursing note and vitals reviewed.    ED Treatments / Results  Labs (all labs ordered are listed, but only abnormal results are displayed) Labs Reviewed  BASIC METABOLIC PANEL - Abnormal; Notable for the following:       Result Value   Chloride 98 (*)    Glucose, Bld 150 (*)    All other components within normal limits  CBC  D-DIMER, QUANTITATIVE (NOT AT [facility])  I-STAT TROPONIN, ED  POCT I-STAT TROPONIN I  I-STAT TROPONIN, ED  POCT I-STAT TROPONIN I    EKG  EKG Interpretation  Date/Time:  Thursday November 08 2016 18:26:31 EDT Ventricular Rate:  96 PR Interval:    QRS Duration: 111 QT Interval:  344 QTC Calculation: 435 R Axis:   115 Text Interpretation:  Sinus rhythm Right axis deviation Low voltage, precordial leads RSR' in V1 or V2, probably normal variant Borderline repolarization abnormality No acute changes No significant change since last tracing Confirmed by Varney Biles [phone]) on 11/09/2016 12:30:31 AM       Radiology Dg Chest 2 View  Result Date: 11/08/2016 CLINICAL DATA:  Chest pain EXAM: CHEST  2 VIEW COMPARISON:  07/25/2010 FINDINGS: The heart size and mediastinal contours are within normal limits. Both lungs are clear. Mild degenerative changes of the spine. IMPRESSION: No active cardiopulmonary disease. Electronically Signed   By: Donavan Foil M.D.   On: 11/08/2016 18:47    Procedures Procedures (including critical care time)  Medications Ordered in ED Medications  naproxen (NAPROSYN) tablet 500 mg (500 mg Oral Given 11/09/16 0131)     Initial Impression / Assessment and Plan / ED Course  I have reviewed the triage vital signs and the nursing notes.  Pertinent labs & imaging results that were available during my care of the  patient were reviewed by me and considered in my medical decision making (see chart for details).  Clinical Course as of Nov 09 252  Fri Nov 09, 2016  0253 Patient reassessed. Pt is comfortable at this time.  Results of the workup discussed. Strict ER return precautions discussed. Follow up instruction discussed, and pt agrees with the plan and is comfortable with it.   [AN]    Clinical Course User Index [AN] Varney Biles, MD    Differential diagnosis includes: ACS syndrome Aortic dissection CHF exacerbation Valvular disorder Myocarditis Pericarditis Pneumonia PE Pneumothorax Musculoskeletal pain PUD /  Gastritis / Esophagitis Esophageal spasm  Pt comes in with chest pain that is not very typical of ACS. Pt's HEAR score is 4 (2 for age and 1 for ekg and risk factors). Plan is to get trops x 2, if neg, we will advise close pcp f/u.  Pt has hx of cancer. There was some dib, and possibly the pain was worse with deep inspiration at onset. Pt's WELLS score puts her at low risk - we will get dimer.    Final Clinical Impressions(s) / ED Diagnoses   Final diagnoses:  Precordial pain  Nonspecific chest pain    New Prescriptions Discharge Medication List as of 11/09/2016  2:22 AM    START taking these medications   Details  naproxen (NAPROSYN) 375 MG tablet Take 1 tablet (375 mg total) by mouth 2 (two) times daily., Starting Fri 11/09/2016, Print         Varney Biles, MD 11/09/16 [phone]

## 2016-11-09 NOTE — Discharge Instructions (Signed)
We saw you in the ER for the chest pain/shortness of breath. All of our cardiac workup is normal, including labs, EKG and chest X-RAY. We screened you for blood clot with a test, and that blood test is also normal. We are not sure what is causing your discomfort, but we feel comfortable sending you home at this time. The workup in the ER is not complete, and you should follow up with your primary care doctor for further evaluation.  Please return to the ER if you have worsening chest pain, shortness of breath, pain radiating to your jaw, shoulder, or back, sweats or fainting. Otherwise see the Cardiologist or your primary care doctor as requested.

## 2016-12-12 DIAGNOSIS — Z23 Encounter for immunization: Secondary | ICD-10-CM | POA: Diagnosis not present

## 2016-12-21 DIAGNOSIS — H2513 Age-related nuclear cataract, bilateral: Secondary | ICD-10-CM | POA: Diagnosis not present

## 2016-12-21 DIAGNOSIS — H43811 Vitreous degeneration, right eye: Secondary | ICD-10-CM | POA: Diagnosis not present

## 2016-12-21 DIAGNOSIS — H25013 Cortical age-related cataract, bilateral: Secondary | ICD-10-CM | POA: Diagnosis not present

## 2016-12-21 DIAGNOSIS — H4322 Crystalline deposits in vitreous body, left eye: Secondary | ICD-10-CM | POA: Diagnosis not present

## 2017-01-15 DIAGNOSIS — H25013 Cortical age-related cataract, bilateral: Secondary | ICD-10-CM | POA: Diagnosis not present

## 2017-01-15 DIAGNOSIS — H2512 Age-related nuclear cataract, left eye: Secondary | ICD-10-CM | POA: Diagnosis not present

## 2017-01-15 DIAGNOSIS — H25012 Cortical age-related cataract, left eye: Secondary | ICD-10-CM | POA: Diagnosis not present

## 2017-01-15 DIAGNOSIS — H33312 Horseshoe tear of retina without detachment, left eye: Secondary | ICD-10-CM | POA: Diagnosis not present

## 2017-01-15 DIAGNOSIS — H2513 Age-related nuclear cataract, bilateral: Secondary | ICD-10-CM | POA: Diagnosis not present

## 2017-01-15 DIAGNOSIS — H35033 Hypertensive retinopathy, bilateral: Secondary | ICD-10-CM | POA: Diagnosis not present

## 2017-01-16 NOTE — Progress Notes (Signed)
[facility] Note  01/21/2017     CHIEF COMPLAINT Patient presents for Retina Evaluation   HISTORY OF PRESENT ILLNESS: [name] is a 66 y.o. female who presents to the clinic today for:   HPI    Retina Evaluation    In both eyes.  This started 1 year ago.  Associated Symptoms Flashes and Floaters.  Negative for Blind Spot, Glare, Shoulder/Hip pain, Fatigue, Jaw Claudication, Photophobia, Distortion, Redness, Scalp Tenderness, Weight Loss, Fever, Trauma and Pain.  Context:  distance vision, mid-range vision and near vision.  Treatments tried include no treatments.  I, the attending physician,  performed the HPI with the patient and updated documentation appropriately.          Comments    Referral of DR. Weaver Eval. Cat. Clearance/ Retinal tear/Tupt Os prior to cat sx. Patient states she has floaters,flashes Ou for appx one yr. Denies pain.She states as a child she had two sx on left eye to correct eye from drawing inward Denies eye gtts. Take vits Qd        Last edited by Bernarda Caffey, MD on 01/21/2017  9:06 AM. (History)      Referring physician: Hortencia Pilar, MD [place], [place] [address]  HISTORICAL INFORMATION:   Selected notes from the MEDICAL RECORD NUMBER Referred by Dr. Read Drivers for concern of retinal tear/tuft OS prior to cataract sx; LEE- 11.13.18 (C.Weaver) [OD: 20/40-1 OS: 20/40] Ocular Hx- DES OU, floaters OS, cataract OU, amblyopia OS, S/P strabismus surgery recession OU PMH- HTN, hx breast ca, uterus ca, Barrets esophagus, high chol., asthma, headaches, arthritis   CURRENT MEDICATIONS: Current Outpatient Medications (Ophthalmic Drugs)  Medication Sig  . prednisoLONE acetate (PRED FORTE) 1 % ophthalmic suspension Place 1 drop 4 (four) times daily for 7 days into the left eye.   No current facility-administered medications for this visit.  (Ophthalmic Drugs)   Current Outpatient  Medications (Other)  Medication Sig  . acetaminophen (TYLENOL) 500 MG tablet Take 1,000 mg by mouth every 6 (six) hours as needed for moderate pain or headache.  Marland Kitchen aspirin 325 MG tablet Take 325 mg by mouth once.  Marland Kitchen lisinopril-hydrochlorothiazide (PRINZIDE,ZESTORETIC) 20-12.5 MG per tablet Take 0.5 tablets by mouth daily.   . naproxen (NAPROSYN) 375 MG tablet Take 1 tablet (375 mg total) by mouth 2 (two) times daily.  . pantoprazole (PROTONIX) 40 MG tablet Take 40 mg by mouth daily.   No current facility-administered medications for this visit.  (Other)      REVIEW OF SYSTEMS: ROS    Positive for: Eyes   Negative for: Constitutional, Gastrointestinal, Neurological, Genitourinary, Musculoskeletal, HENT, Endocrine, Cardiovascular, Respiratory, Psychiatric, Allergic/Imm, Heme/Lymph   Last edited by Zenovia Jordan, LPN on 31/54/0086  7:61 AM. (History)       ALLERGIES Allergies  Allergen Reactions  . Citrus     burning  . Lactose Intolerance (Gi)     Upset stomach  . Lipitor [Atorvastatin]     unknown  . Penicillins     Unsure of reaction Has patient had a PCN reaction causing immediate rash, facial/tongue/throat swelling, SOB or lightheadedness with hypotension: unknown Has patient had a PCN reaction causing severe rash involving mucus membranes or skin necrosis: unknown Has patient had a PCN reaction that required hospitalization  Has the patient had a PCN reaction occurring within the last 10 years: unknown If all of the above answers are "NO", then may proceed with  Cephalosporin use.     PAST MEDICAL HISTORY Past Medical History:  Diagnosis Date  . Allergy    SEASONAL  . Arthritis    THUMB  . Asthma   . Barrett's esophagus   . Breast cancer ([place])    both breasts  . Endometrioid adenocarcinoma   . Fundic gland polyps of stomach, benign   . GERD (gastroesophageal reflux disease)   . Hiatal hernia   . Hyperlipidemia   . Hypertension    Past Surgical History:   Procedure Laterality Date  . BREAST RECONSTRUCTION    . COLONOSCOPY    . ENDOMETRIAL BIOPSY    . EYE SURGERY    . MASTECTOMY    . TOTAL ABDOMINAL HYSTERECTOMY    . UPPER GASTROINTESTINAL ENDOSCOPY      FAMILY HISTORY Family History  Problem Relation Age of Onset  . Brain cancer Maternal Uncle   . Breast cancer Mother   . Breast cancer Maternal Aunt   . Irritable bowel syndrome Maternal Grandfather   . Stroke Maternal Grandmother   . Colon cancer Neg Hx   . Esophageal cancer Neg Hx   . Stomach cancer Neg Hx   . Rectal cancer Neg Hx     SOCIAL HISTORY Social History   Tobacco Use  . Smoking status: Never Smoker  . Smokeless tobacco: Never Used  Substance Use Topics  . Alcohol use: Yes    Comment: rarely  . Drug use: No         OPHTHALMIC EXAM:  Base Eye Exam    Visual Acuity (Snellen - Linear)      Right Left   Dist cc 20/30 -1 20/30 +2   Dist ph cc NI NI       Tonometry (Tonopen, 9:11 AM)      Right Left   Pressure 22 22       Pupils      Dark Light Shape React APD   Right 4 3 Round 2 None   Left 4 3 Round 2 None       Visual Fields (Counting fingers)      Left Right    Full Full       Extraocular Movement      Right Left    Full, Ortho Full, Ortho       Neuro/Psych    Oriented x3:  Yes   Mood/Affect:  Normal       Dilation    Both eyes:  1.0% Mydriacyl, 2.5% Phenylephrine @ 9:11 AM        Slit Lamp and Fundus Exam    External Exam      Right Left   External Normal Normal       Slit Lamp Exam      Right Left   Lids/Lashes dermatochalasis dermatochalasis   Conjunctiva/Sclera normal normal   Cornea Clear Clear   Anterior Chamber Deep and quiet Deep and quiet   Iris Round, dilated Round, dilated   Lens 2+ NSC; 3+ CC 2+ NSC; 3+ CC   Vitreous syneresis syneresis; mild asteroid       Fundus Exam      Right Left   Disc Normal Normal   C/D Ratio 0.4 0.4   Macula flat; RPE mottling flat; RPE mottling; tr ERM   Vessels mild  attenuattion, tortuosity mild attenuattion, tortuosity   Periphery attached Attached; VR tuft / CR scar at 1030;  linear pigmented CR scars with tufting/elevation at 130  Refraction    Wearing Rx      Sphere Cylinder Axis Add   Right +0.75 +0.75 155 +2.50   Left +1.25 +0.75 150 +2.50   Type:  PAL       Manifest Refraction (Over)      Sphere Cylinder Axis Dist VA   Right +0.50 +1.25 155 20/25   Left +1.25 +0.50 138 20/20-1          IMAGING AND PROCEDURES  Imaging and Procedures for 01/21/17  OCT, Retina - OU - Both Eyes     Right Eye Quality was good. Central Foveal Thickness: 285. Progression has no prior data. Findings include normal foveal contour, no IRF, no SRF.   Left Eye Quality was good. Central Foveal Thickness: 287. Progression has no prior data. Findings include normal foveal contour, no IRF, no SRF.   Notes Images taken, stored on drive  Diagnosis / Impression:  NFP; no IRF/SRF OU  Clinical management:  See below  Abbreviations: NFP - Normal foveal profile. CME - cystoid macular edema. PED - pigment epithelial detachment. IRF - intraretinal fluid. SRF - subretinal fluid. EZ - ellipsoid zone. ERM - epiretinal membrane. ORA - outer retinal atrophy. ORT - outer retinal tubulation. SRHM - subretinal hyper-reflective material         Repair Retinal Breaks, Laser - OS - Left Eye     LASER PROCEDURE NOTE  Procedure:  Barrier laser retinopexy using laser indirect ophthalmoscope, LEFT eye   Diagnosis:   Retinal breaks, LEFT eye                     Retinal breaks / VR tufts at 1030 and 130 anterior to equator  Surgeon: Bernarda Caffey, MD, PhD  Anesthesia: Topical  Informed consent obtained, operative eye marked, and time out performed prior to initiation of laser.   Laser settings:  Lumenis [id number] laser indirect ophthalmoscope Power: 300 mW Duration: 70 msec  # spots: 450  Placement of laser: Laser was placed in three confluent rows around  linear breaks at 130 and VR tuft at 1030 with additional rows anteriorly.  Complications: None.  Patient tolerated the procedure well and received written and verbal post-procedure care information/education.                 ASSESSMENT/PLAN:    ICD-10-CM   1. Retinal breaks without detachment H35.89 Repair Retinal Breaks, Laser - OS - Left Eye  2. Hypertensive retinopathy of both eyes H35.033   3. Retinal edema H35.81 OCT, Retina - OU - Both Eyes  4. History of strabismus Z86.69   5. Combined form of age-related cataract, both eyes H25.813     1. Retinal breaks / vitreoretinal tufts OS - The incidence, risk factors, and natural history of retinal tears was discussed with patient.   - Potential treatment options including laser retinopexy and cryotherapy discussed with patient. - VR tufts located at 130 and 1030 - recommend laser retinopexy - RBA of procedure discussed, questions answered - informed consent obtained and signed - see procedure note - start PF QID OS x7 days - f/u in 2 wks  2. Hypertensive retinopathy OU - discussed importance of tight BP control - monitor  3. No retinal edema  4. Hx of strabismus - s/p strabismus surgery OS x2 as a child - stable, monitor  5. Visually significant cataracts OU - The symptoms of cataract, surgical options, and treatments and risks were discussed with patient. - discussed diagnosis and progression -  under the expert management of Dr. Kathlen Mody - is scheduled to have left eye cataract surgery done first week of Dec -- if not otherwise contraindicated, would recommend switching order of eyes to allow OS laser to settle in - letter to Dr. Kathlen Mody   Ophthalmic Meds Ordered this visit:  Meds ordered this encounter  Medications  . prednisoLONE acetate (PRED FORTE) 1 % ophthalmic suspension    Sig: Place 1 drop 4 (four) times daily for 7 days into the left eye.    Dispense:  10 mL    Refill:  0       Return in about 2  weeks (around 02/04/2017) for POV s/p laser retinopexy OS.  There are no Patient Instructions on file for this visit.   Explained the diagnoses, plan, and follow up with the patient and they expressed understanding.  Patient expressed understanding of the importance of proper follow up care.   Gardiner Sleeper, M.D., Ph.D. Diseases & Surgery of the Retina and Vitreous [facility] 01/21/17     Abbreviations: M myopia (nearsighted); A astigmatism; H hyperopia (farsighted); P presbyopia; Mrx spectacle prescription;  CTL contact lenses; OD right eye; OS left eye; OU both eyes  XT exotropia; ET esotropia; PEK punctate epithelial keratitis; PEE punctate epithelial erosions; DES dry eye syndrome; MGD meibomian gland dysfunction; ATs artificial tears; PFAT's preservative free artificial tears; [place] nuclear sclerotic cataract; PSC posterior subcapsular cataract; ERM epi-retinal membrane; PVD posterior vitreous detachment; RD retinal detachment; DM diabetes mellitus; DR diabetic retinopathy; NPDR non-proliferative diabetic retinopathy; PDR proliferative diabetic retinopathy; CSME clinically significant macular edema; DME diabetic macular edema; dbh dot blot hemorrhages; CWS cotton wool spot; POAG primary open angle glaucoma; C/D cup-to-disc ratio; HVF humphrey visual field; GVF goldmann visual field; OCT optical coherence tomography; IOP intraocular pressure; BRVO Branch retinal vein occlusion; CRVO central retinal vein occlusion; CRAO central retinal artery occlusion; BRAO branch retinal artery occlusion; RT retinal tear; SB scleral buckle; PPV pars plana vitrectomy; VH Vitreous hemorrhage; PRP panretinal laser photocoagulation; IVK intravitreal kenalog; VMT vitreomacular traction; MH Macular hole;  NVD neovascularization of the disc; NVE neovascularization elsewhere; AREDS age related eye disease study; ARMD age related macular degeneration; POAG primary open angle glaucoma; EBMD  epithelial/anterior basement membrane dystrophy; ACIOL anterior chamber intraocular lens; IOL intraocular lens; PCIOL posterior chamber intraocular lens; Phaco/IOL phacoemulsification with intraocular lens placement; [place] photorefractive keratectomy; LASIK laser assisted in situ keratomileusis; HTN hypertension; DM diabetes mellitus; COPD chronic obstructive pulmonary disease

## 2017-01-21 ENCOUNTER — Encounter (INDEPENDENT_AMBULATORY_CARE_PROVIDER_SITE_OTHER): Payer: Self-pay | Admitting: Ophthalmology

## 2017-01-21 ENCOUNTER — Ambulatory Visit (INDEPENDENT_AMBULATORY_CARE_PROVIDER_SITE_OTHER): Payer: Medicare Other | Admitting: Ophthalmology

## 2017-01-21 DIAGNOSIS — H35033 Hypertensive retinopathy, bilateral: Secondary | ICD-10-CM

## 2017-01-21 DIAGNOSIS — Z8669 Personal history of other diseases of the nervous system and sense organs: Secondary | ICD-10-CM | POA: Diagnosis not present

## 2017-01-21 DIAGNOSIS — H25813 Combined forms of age-related cataract, bilateral: Secondary | ICD-10-CM

## 2017-01-21 DIAGNOSIS — H3589 Other specified retinal disorders: Secondary | ICD-10-CM

## 2017-01-21 DIAGNOSIS — H3581 Retinal edema: Secondary | ICD-10-CM

## 2017-01-21 MED ORDER — PREDNISOLONE ACETATE 1 % OP SUSP: 1.0000 [drp] | Freq: Four times a day (QID) | OPHTHALMIC | 0 refills | Status: AC

## 2017-02-01 NOTE — Progress Notes (Signed)
[facility] Note  02/04/2017     CHIEF COMPLAINT Patient presents for Retina Follow Up   HISTORY OF PRESENT ILLNESS: [name] is a 66 y.o. female who presents to the clinic today for:   HPI    Retina Follow Up    In left eye.  This started 1 year ago.  Severity is mild.  Since onset it is stable.  I, the attending physician,  performed the HPI with the patient and updated documentation appropriately.          Comments    S/P Laser retinopexy OS. Patient states she is going to have Cat. Sx OD Wednesday 02/06/17  W/Dr. Kathlen Mody . Pt reports she has occasional floaters and blurred vision  Os and occasional Flashes OD . Pt is using Pred Forte gtts QID  Os (except for yesterday due to family emergency ). Started two  eye gtts( unknown Rx) today per Dr. Kathlen Mody . Denies vits        Last edited by Bernarda Caffey, MD on 02/04/2017 11:08 AM. (History)      Referring physician: Hortencia Pilar, MD [place], [place] [address]  HISTORICAL INFORMATION:   Selected notes from the MEDICAL RECORD NUMBER Referred by Dr. Read Drivers for concern of retinal tear/tuft OS prior to cataract sx; LEE- 11.13.18 (C.Weaver) [OD: 20/40-1 OS: 20/40] Ocular Hx- DES OU, floaters OS, cataract OU, amblyopia OS, S/P strabismus surgery recession OU PMH- HTN, hx breast ca, uterus ca, Barrets esophagus, high chol., asthma, headaches, arthritis   CURRENT MEDICATIONS: No current outpatient medications on file. (Ophthalmic Drugs)   No current facility-administered medications for this visit.  (Ophthalmic Drugs)   Current Outpatient Medications (Other)  Medication Sig  . acetaminophen (TYLENOL) 500 MG tablet Take 1,000 mg by mouth every 6 (six) hours as needed for moderate pain or headache.  Marland Kitchen aspirin 325 MG tablet Take 325 mg by mouth once.  Marland Kitchen lisinopril-hydrochlorothiazide (PRINZIDE,ZESTORETIC) 20-12.5 MG per tablet Take 0.5 tablets by mouth daily.   .  naproxen (NAPROSYN) 375 MG tablet Take 1 tablet (375 mg total) by mouth 2 (two) times daily.  . pantoprazole (PROTONIX) 40 MG tablet Take 40 mg by mouth daily.   No current facility-administered medications for this visit.  (Other)      REVIEW OF SYSTEMS: ROS    Positive for: Eyes   Negative for: Constitutional, Gastrointestinal, Neurological, Skin, Genitourinary, Musculoskeletal, HENT, Endocrine, Cardiovascular, Respiratory, Psychiatric, Allergic/Imm, Heme/Lymph   Last edited by Zenovia Jordan, LPN on 67/05/4191 79:02 AM. (History)       ALLERGIES Allergies  Allergen Reactions  . Citrus     burning  . Lactose Intolerance (Gi)     Upset stomach  . Lipitor [Atorvastatin]     unknown  . Penicillins     Unsure of reaction Has patient had a PCN reaction causing immediate rash, facial/tongue/throat swelling, SOB or lightheadedness with hypotension: unknown Has patient had a PCN reaction causing severe rash involving mucus membranes or skin necrosis: unknown Has patient had a PCN reaction that required hospitalization  Has the patient had a PCN reaction occurring within the last 10 years: unknown If all of the above answers are "NO", then may proceed with Cephalosporin use.     PAST MEDICAL HISTORY Past Medical History:  Diagnosis Date  . Allergy    SEASONAL  . Arthritis    THUMB  . Asthma   . Barrett's esophagus   .  Breast cancer ([place])    both breasts  . Endometrioid adenocarcinoma   . Fundic gland polyps of stomach, benign   . GERD (gastroesophageal reflux disease)   . Hiatal hernia   . Hyperlipidemia   . Hypertension    Past Surgical History:  Procedure Laterality Date  . BREAST RECONSTRUCTION    . COLONOSCOPY    . ENDOMETRIAL BIOPSY    . EYE SURGERY    . MASTECTOMY    . TOTAL ABDOMINAL HYSTERECTOMY    . UPPER GASTROINTESTINAL ENDOSCOPY      FAMILY HISTORY Family History  Problem Relation Age of Onset  . Brain cancer Maternal Uncle   . Breast cancer  Mother   . Breast cancer Maternal Aunt   . Irritable bowel syndrome Maternal Grandfather   . Stroke Maternal Grandmother   . Colon cancer Neg Hx   . Esophageal cancer Neg Hx   . Stomach cancer Neg Hx   . Rectal cancer Neg Hx     SOCIAL HISTORY Social History   Tobacco Use  . Smoking status: Never Smoker  . Smokeless tobacco: Never Used  Substance Use Topics  . Alcohol use: Yes    Comment: rarely  . Drug use: No         OPHTHALMIC EXAM:  Base Eye Exam    Visual Acuity (Snellen - Linear)      Right Left   Dist cc 20/30 -2 20/30   Dist ph cc 20/25 20/25+2  Work up done by [employer] (Tonopen, 10:36 AM)      Right Left   Pressure 15 15       Pupils      Dark Shape APD   Right 4 Round None   Left 4 Round None       Visual Fields (Counting fingers)      Left Right    Full Full       Extraocular Movement      Right Left    Full, Ortho Full, Ortho       Neuro/Psych    Oriented x3:  Yes   Mood/Affect:  Normal       Dilation    Both eyes:  1.0% Mydriacyl, 2.5% Phenylephrine @ 10:36 AM        Slit Lamp and Fundus Exam    External Exam      Right Left   External Normal Normal       Slit Lamp Exam      Right Left   Lids/Lashes dermatochalasis dermatochalasis   Conjunctiva/Sclera normal normal   Cornea Clear Clear   Anterior Chamber Deep and quiet Deep and quiet   Iris Round, dilated Round, dilated   Lens 2+ NSC; 3+ CC 2+ NSC; 3+ CC   Vitreous syneresis syneresis; mild asteroid       Fundus Exam      Right Left   Disc Normal Normal   C/D Ratio 0.4 0.4   Macula flat; RPE mottling flat; RPE mottling; tr ERM   Vessels mild attenuattion, tortuosity mild attenuattion, tortuosity   Periphery Attached, punctate pigment clump at 0300 equator  Attached; VR tuft / CR scar at 1030 w/ good laser surrounding;  linear pigmented CR scars with tufting/elevation/operculum at 0130 -- good laser surrounding, No SRF          IMAGING AND  PROCEDURES  Imaging and Procedures for 02/04/17  OCT, Retina - OU - Both Eyes  Right Eye Quality was good. Central Foveal Thickness: 287. Progression has been stable. Findings include normal foveal contour, no IRF, no SRF.   Left Eye Quality was good. Central Foveal Thickness: 293. Progression has been stable. Findings include normal foveal contour, no IRF, no SRF, epiretinal membrane.   Notes Images taken, stored on drive  Diagnosis / Impression:  NFP; no IRF/SRF OU OS: trace ERM inferiorly  Clinical management:  See below  Abbreviations: NFP - Normal foveal profile. CME - cystoid macular edema. PED - pigment epithelial detachment. IRF - intraretinal fluid. SRF - subretinal fluid. EZ - ellipsoid zone. ERM - epiretinal membrane. ORA - outer retinal atrophy. ORT - outer retinal tubulation. SRHM - subretinal hyper-reflective material                  ASSESSMENT/PLAN:    ICD-10-CM   1. Retinal breaks without detachment H35.89 OCT, Retina - OU - Both Eyes  2. Hypertensive retinopathy of both eyes H35.033   3. Retinal edema H35.81   4. History of strabismus Z86.69   5. Combined form of age-related cataract, both eyes H25.813     1. Retinal breaks / vitreoretinal tufts OS - VR tufts located at 0130 (w/ operculum) and 1030 - S/P laser retinopexy OS (11.19.18) - laser looks good - f/u in 6-8 weeks  2. Hypertensive retinopathy OU - discussed importance of tight BP control - monitor  3. No retinal edema  4. Hx of strabismus - s/p strabismus surgery OS x2 as a child - stable, monitor  5. Visually significant cataracts OU - The symptoms of cataract, surgical options, and treatments and risks were discussed with patient. - discussed diagnosis and progression - under the expert management of Dr. Kathlen Mody - is scheduled to have right eye cataract surgery done this week and OS in early January   Ophthalmic Meds Ordered this visit:  No orders of the defined types  were placed in this encounter.      Return in about 7 weeks (around 03/25/2017) for F/U VR tufts OS; S/p laser retinopexy OS (11.19.18).  There are no Patient Instructions on file for this visit.   Explained the diagnoses, plan, and follow up with the patient and they expressed understanding.  Patient expressed understanding of the importance of proper follow up care.   I,Natelie Ostrosky,acting as a scribe for Bernarda Caffey, MD.,have documented all relevant documentation on the behalf of Bernarda Caffey, MD,as directed by  Bernarda Caffey, MD while in the presence of Bernarda Caffey, MD.   Gardiner Sleeper, M.D., Ph.D. Diseases & Surgery of the Retina and Vitreous [facility] 02/04/17     Abbreviations: M myopia (nearsighted); A astigmatism; H hyperopia (farsighted); P presbyopia; Mrx spectacle prescription;  CTL contact lenses; OD right eye; OS left eye; OU both eyes  XT exotropia; ET esotropia; PEK punctate epithelial keratitis; PEE punctate epithelial erosions; DES dry eye syndrome; MGD meibomian gland dysfunction; ATs artificial tears; PFAT's preservative free artificial tears; [place] nuclear sclerotic cataract; PSC posterior subcapsular cataract; ERM epi-retinal membrane; PVD posterior vitreous detachment; RD retinal detachment; DM diabetes mellitus; DR diabetic retinopathy; NPDR non-proliferative diabetic retinopathy; PDR proliferative diabetic retinopathy; CSME clinically significant macular edema; DME diabetic macular edema; dbh dot blot hemorrhages; CWS cotton wool spot; POAG primary open angle glaucoma; C/D cup-to-disc ratio; HVF humphrey visual field; GVF goldmann visual field; OCT optical coherence tomography; IOP intraocular pressure; BRVO Branch retinal vein occlusion; CRVO central retinal vein occlusion; CRAO central retinal artery  occlusion; BRAO branch retinal artery occlusion; RT retinal tear; SB scleral buckle; PPV pars plana vitrectomy; VH Vitreous hemorrhage; PRP  panretinal laser photocoagulation; IVK intravitreal kenalog; VMT vitreomacular traction; MH Macular hole;  NVD neovascularization of the disc; NVE neovascularization elsewhere; AREDS age related eye disease study; ARMD age related macular degeneration; POAG primary open angle glaucoma; EBMD epithelial/anterior basement membrane dystrophy; ACIOL anterior chamber intraocular lens; IOL intraocular lens; PCIOL posterior chamber intraocular lens; Phaco/IOL phacoemulsification with intraocular lens placement; [place] photorefractive keratectomy; LASIK laser assisted in situ keratomileusis; HTN hypertension; DM diabetes mellitus; COPD chronic obstructive pulmonary disease

## 2017-02-04 ENCOUNTER — Ambulatory Visit (INDEPENDENT_AMBULATORY_CARE_PROVIDER_SITE_OTHER): Payer: Medicare Other | Admitting: Ophthalmology

## 2017-02-04 ENCOUNTER — Encounter (INDEPENDENT_AMBULATORY_CARE_PROVIDER_SITE_OTHER): Payer: Self-pay | Admitting: Ophthalmology

## 2017-02-04 DIAGNOSIS — H3589 Other specified retinal disorders: Secondary | ICD-10-CM | POA: Diagnosis not present

## 2017-02-04 DIAGNOSIS — H25813 Combined forms of age-related cataract, bilateral: Secondary | ICD-10-CM

## 2017-02-04 DIAGNOSIS — H35033 Hypertensive retinopathy, bilateral: Secondary | ICD-10-CM

## 2017-02-04 DIAGNOSIS — H3581 Retinal edema: Secondary | ICD-10-CM

## 2017-02-04 DIAGNOSIS — Z8669 Personal history of other diseases of the nervous system and sense organs: Secondary | ICD-10-CM

## 2017-02-06 DIAGNOSIS — H25811 Combined forms of age-related cataract, right eye: Secondary | ICD-10-CM | POA: Diagnosis not present

## 2017-02-06 DIAGNOSIS — H2511 Age-related nuclear cataract, right eye: Secondary | ICD-10-CM | POA: Diagnosis not present

## 2017-02-15 DIAGNOSIS — H2512 Age-related nuclear cataract, left eye: Secondary | ICD-10-CM | POA: Diagnosis not present

## 2017-03-06 DIAGNOSIS — H25812 Combined forms of age-related cataract, left eye: Secondary | ICD-10-CM | POA: Diagnosis not present

## 2017-03-06 DIAGNOSIS — H2512 Age-related nuclear cataract, left eye: Secondary | ICD-10-CM | POA: Diagnosis not present

## 2017-03-21 NOTE — Progress Notes (Deleted)
[facility] Note  03/25/2017     CHIEF COMPLAINT Patient presents for No chief complaint on file.   HISTORY OF PRESENT ILLNESS: [name] is a 67 y.o. female who presents to the clinic today for:     Referring physician: Jani Gravel, MD [address] [place], [place] [address]  HISTORICAL INFORMATION:   Selected notes from the MEDICAL RECORD NUMBER Referred by Dr. Read Drivers for concern of retinal tear/tuft OS prior to cataract sx; LEE- 11.13.18 ([facility]) [OD: 20/40-1 OS: 20/40] Ocular Hx- DES OU, floaters OS, cataract OU, amblyopia OS, S/P strabismus surgery recession OU PMH- HTN, hx breast ca, uterus ca, Barrets esophagus, high chol., asthma, headaches, arthritis   CURRENT MEDICATIONS: No current outpatient medications on file. (Ophthalmic Drugs)   No current facility-administered medications for this visit.  (Ophthalmic Drugs)   Current Outpatient Medications (Other)  Medication Sig   acetaminophen (TYLENOL) 500 MG tablet Take 1,000 mg by mouth every 6 (six) hours as needed for moderate pain or headache.   aspirin 325 MG tablet Take 325 mg by mouth once.   lisinopril-hydrochlorothiazide (PRINZIDE,ZESTORETIC) 20-12.5 MG per tablet Take 0.5 tablets by mouth daily.    naproxen (NAPROSYN) 375 MG tablet Take 1 tablet (375 mg total) by mouth 2 (two) times daily.   pantoprazole (PROTONIX) 40 MG tablet Take 40 mg by mouth daily.   No current facility-administered medications for this visit.  (Other)      REVIEW OF SYSTEMS:    ALLERGIES Allergies  Allergen Reactions   Citrus     burning   Lactose Intolerance (Gi)     Upset stomach   Lipitor [Atorvastatin]     unknown   Penicillins     Unsure of reaction Has patient had a PCN reaction causing immediate rash, facial/tongue/throat swelling, SOB or lightheadedness with hypotension: unknown Has patient had a PCN reaction causing severe rash involving mucus  membranes or skin necrosis: unknown Has patient had a PCN reaction that required hospitalization  Has the patient had a PCN reaction occurring within the last 10 years: unknown If all of the above answers are "NO", then may proceed with Cephalosporin use.     PAST MEDICAL HISTORY Past Medical History:  Diagnosis Date   Allergy    SEASONAL   Arthritis    THUMB   Asthma    Barrett's esophagus    Breast cancer ([place])    both breasts   Endometrioid adenocarcinoma    Fundic gland polyps of stomach, benign    GERD (gastroesophageal reflux disease)    Hiatal hernia    Hyperlipidemia    Hypertension    Past Surgical History:  Procedure Laterality Date   BREAST RECONSTRUCTION     COLONOSCOPY     ENDOMETRIAL BIOPSY     EYE SURGERY     MASTECTOMY     TOTAL ABDOMINAL HYSTERECTOMY     UPPER GASTROINTESTINAL ENDOSCOPY      FAMILY HISTORY Family History  Problem Relation Age of Onset   Brain cancer Maternal Uncle    Breast cancer Mother    Breast cancer Maternal Aunt    Irritable bowel syndrome Maternal Grandfather    Stroke Maternal Grandmother    Colon cancer Neg Hx    Esophageal cancer Neg Hx    Stomach cancer Neg Hx    Rectal cancer Neg Hx     SOCIAL HISTORY Social History   Tobacco Use   Smoking status: Never  Smoker   Smokeless tobacco: Never Used  Substance Use Topics   Alcohol use: Yes    Comment: rarely   Drug use: No         OPHTHALMIC EXAM:   Not recorded      IMAGING AND PROCEDURES  Imaging and Procedures for 03/21/17           ASSESSMENT/PLAN:    ICD-10-CM   1. Retinal breaks without detachment H35.89   2. Hypertensive retinopathy of both eyes H35.033   3. Retinal edema H35.81 OCT, Retina - OU - Both Eyes  4. History of strabismus Z86.69   5. Combined form of age-related cataract, both eyes H25.813     1. Retinal breaks / vitreoretinal tufts OS - VR tufts located at 0130 (w/ operculum) and 1030 -  S/P laser retinopexy OS (11.19.18) - laser looks good - f/u in 6-8 weeks  2. Hypertensive retinopathy OU - discussed importance of tight BP control - monitor  3. No retinal edema  4. Hx of strabismus - s/p strabismus surgery OS x2 as a child - stable, monitor  5. Visually significant cataracts OU - The symptoms of cataract, surgical options, and treatments and risks were discussed with patient. - discussed diagnosis and progression - under the expert management of Dr. Kathlen Mody - is scheduled to have right eye cataract surgery done this week and OS in early January   Ophthalmic Meds Ordered this visit:  No orders of the defined types were placed in this encounter.      No Follow-up on file.  There are no Patient Instructions on file for this visit.   Explained the diagnoses, plan, and follow up with the patient and they expressed understanding.  Patient expressed understanding of the importance of proper follow up care.   This document serves as a record of services personally performed by Gardiner Sleeper, MD, PhD. It was created on their behalf by Catha Brow, [place], a certified ophthalmic assistant. The creation of this record is the provider's dictation and/or activities during the visit.  Electronically signed by: Catha Brow, COA  03/21/17 8:18 AM   Gardiner Sleeper, M.D., Ph.D. Diseases & Surgery of the Retina and Vitreous [facility] 03/21/17     Abbreviations: M myopia (nearsighted); A astigmatism; H hyperopia (farsighted); P presbyopia; Mrx spectacle prescription;  CTL contact lenses; OD right eye; OS left eye; OU both eyes  XT exotropia; ET esotropia; PEK punctate epithelial keratitis; PEE punctate epithelial erosions; DES dry eye syndrome; MGD meibomian gland dysfunction; ATs artificial tears; PFAT's preservative free artificial tears; [place] nuclear sclerotic cataract; PSC posterior subcapsular cataract; ERM epi-retinal membrane; PVD  posterior vitreous detachment; RD retinal detachment; DM diabetes mellitus; DR diabetic retinopathy; NPDR non-proliferative diabetic retinopathy; PDR proliferative diabetic retinopathy; CSME clinically significant macular edema; DME diabetic macular edema; dbh dot blot hemorrhages; CWS cotton wool spot; POAG primary open angle glaucoma; C/D cup-to-disc ratio; HVF humphrey visual field; GVF goldmann visual field; OCT optical coherence tomography; IOP intraocular pressure; BRVO Branch retinal vein occlusion; CRVO central retinal vein occlusion; CRAO central retinal artery occlusion; BRAO branch retinal artery occlusion; RT retinal tear; SB scleral buckle; PPV pars plana vitrectomy; VH Vitreous hemorrhage; PRP panretinal laser photocoagulation; IVK intravitreal kenalog; VMT vitreomacular traction; MH Macular hole;  NVD neovascularization of the disc; NVE neovascularization elsewhere; AREDS age related eye disease study; ARMD age related macular degeneration; POAG primary open angle glaucoma; EBMD epithelial/anterior basement membrane dystrophy; ACIOL anterior chamber intraocular lens;  IOL intraocular lens; PCIOL posterior chamber intraocular lens; Phaco/IOL phacoemulsification with intraocular lens placement; [place] photorefractive keratectomy; LASIK laser assisted in situ keratomileusis; HTN hypertension; DM diabetes mellitus; COPD chronic obstructive pulmonary disease

## 2017-03-25 ENCOUNTER — Encounter (INDEPENDENT_AMBULATORY_CARE_PROVIDER_SITE_OTHER): Payer: Medicare Other | Admitting: Ophthalmology

## 2017-05-22 IMAGING — CT CT HEAD W/O CM
2 series · 16 of 30 positions shown, 19 images · non-contrast
Comparison: PET/CT performed 06/22/2010

CLINICAL DATA: Acute onset of headache and dizziness. Recent
syncopal episode. Rectal bleeding. Initial encounter.

EXAM:
CT HEAD WITHOUT CONTRAST
TECHNIQUE: Contiguous axial images were obtained from the base of the skull
through the vertex without intravenous contrast.

[Series 2: head w/o · axial · non-contrast · 0.40mm/px · z∈[-190,-70]mm · 9 of 30 slices shown, 12 images]
[im 3/30  brain]
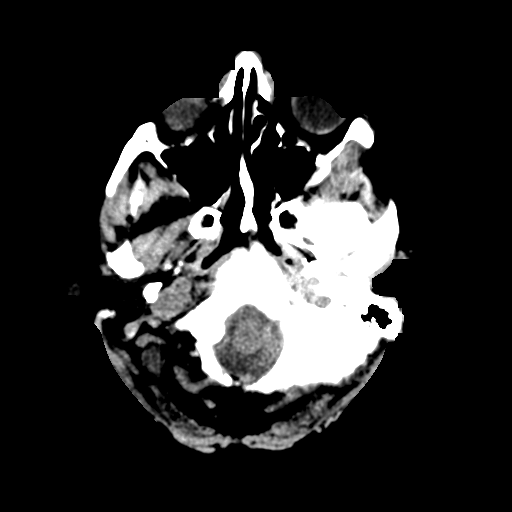
[im 3/30  bone]
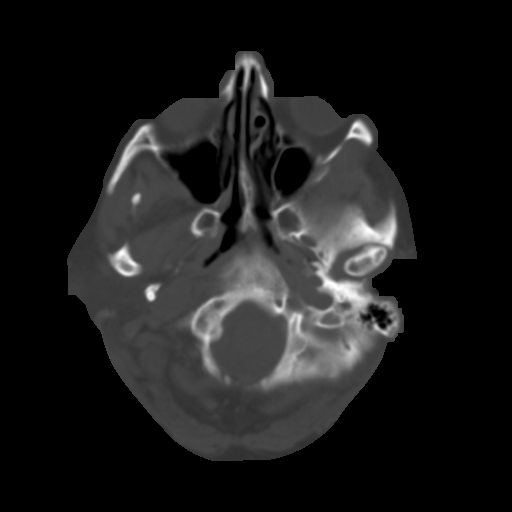
[im 6/30  brain]
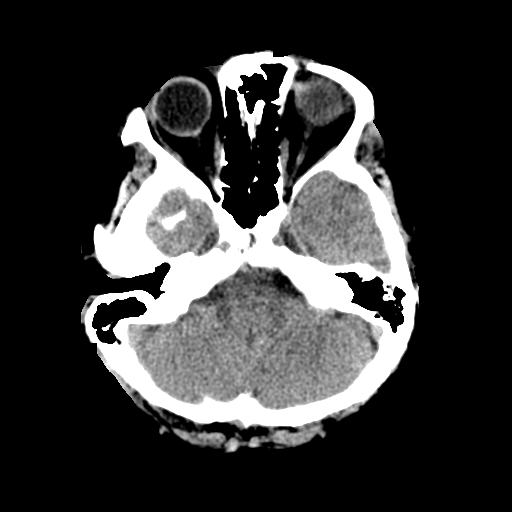
[im 9/30  brain]
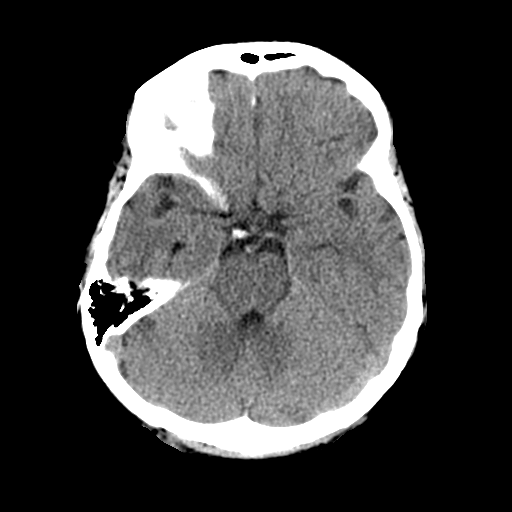
[im 12/30  brain]
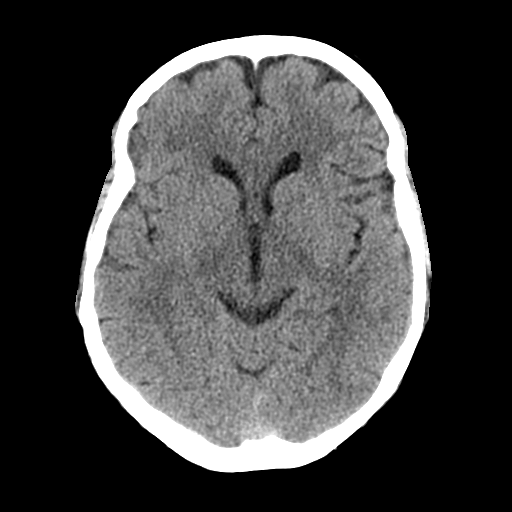
[im 15/30  brain]
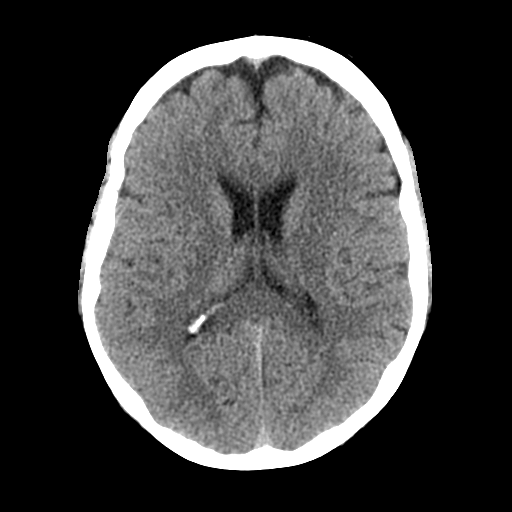
[im 15/30  bone]
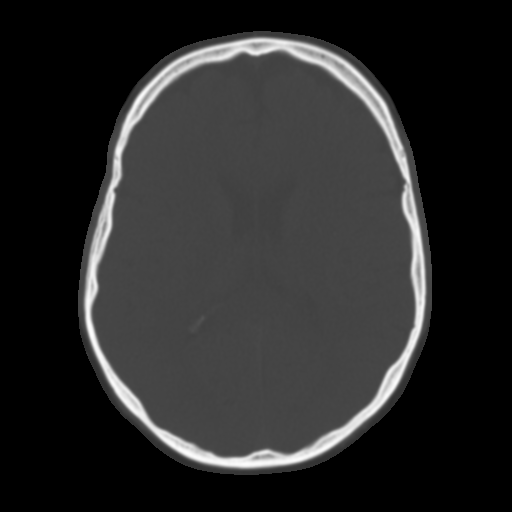
[im 18/30  brain]
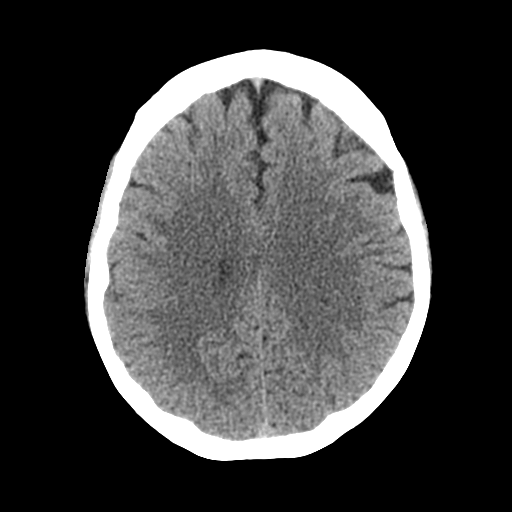
[im 21/30  brain]
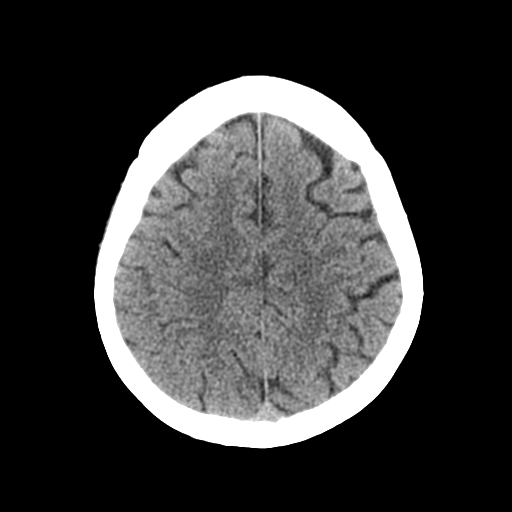
[im 24/30  brain]
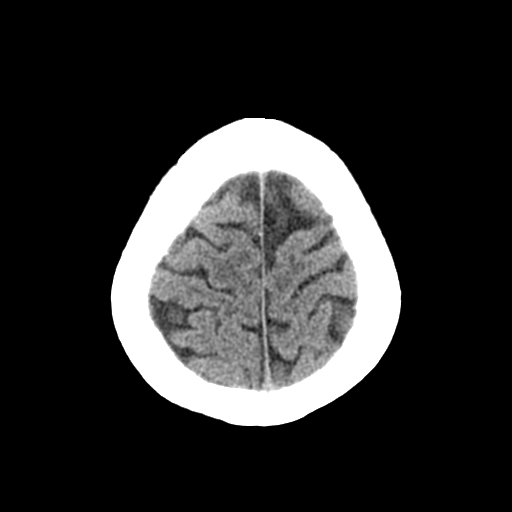
[im 27/30  brain]
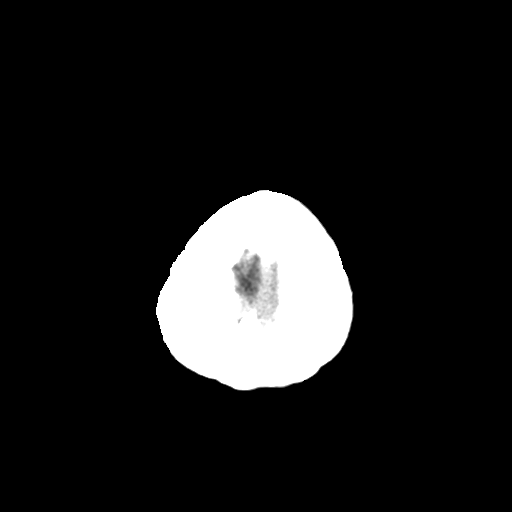
[im 27/30  bone]
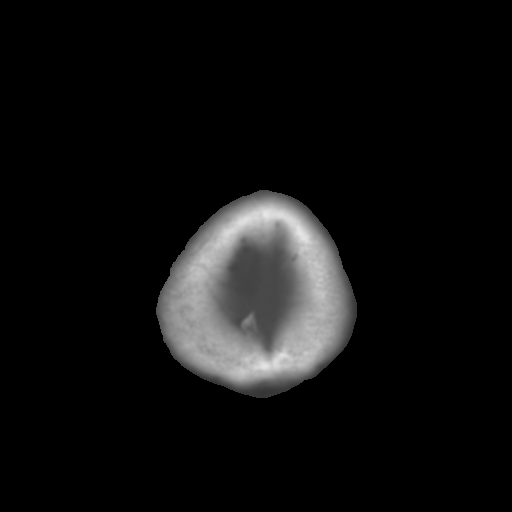

[Series 3: bone windows · axial · 0.40mm/px · z∈[-185,-86]mm · 7 of 50 slices shown]
[im 6/50  bone]
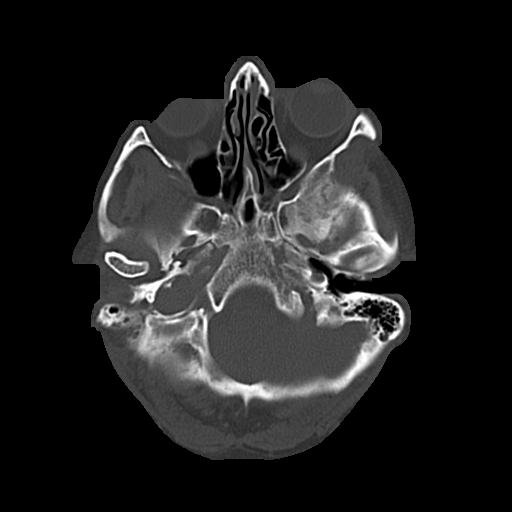
[im 11/50  bone]
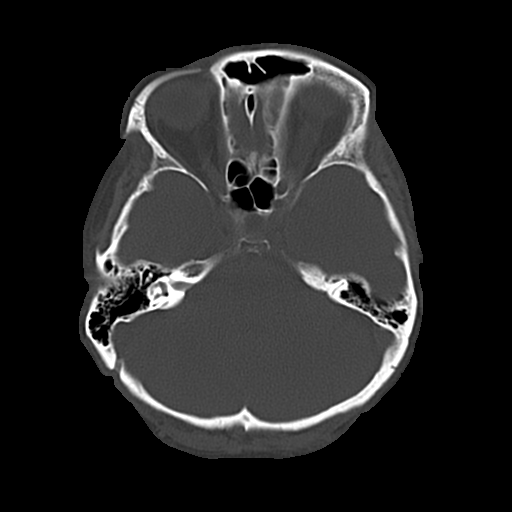
[im 17/50  bone]
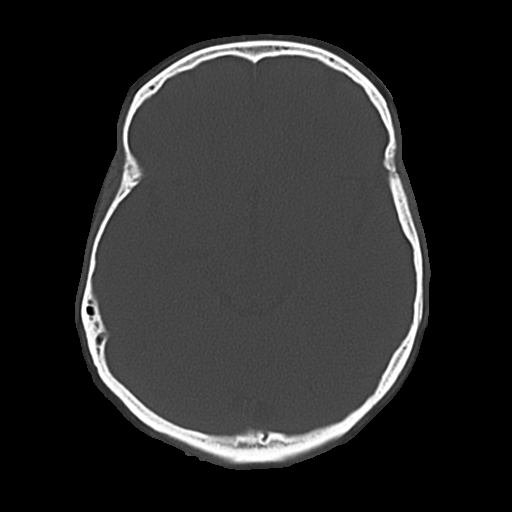
[im 22/50  bone]
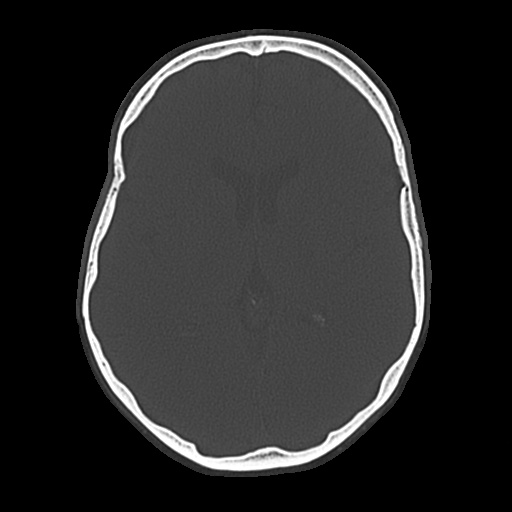
[im 28/50  bone]
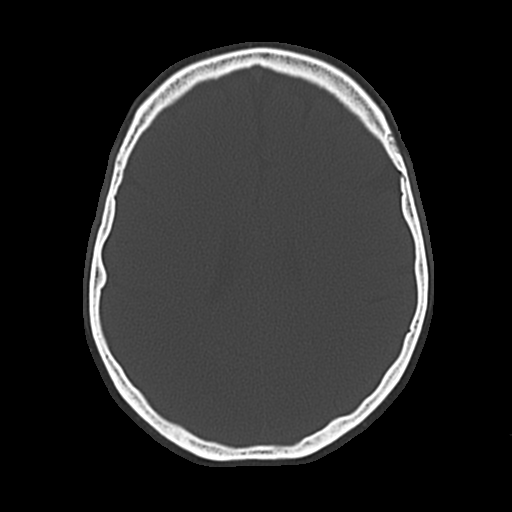
[im 33/50  bone]
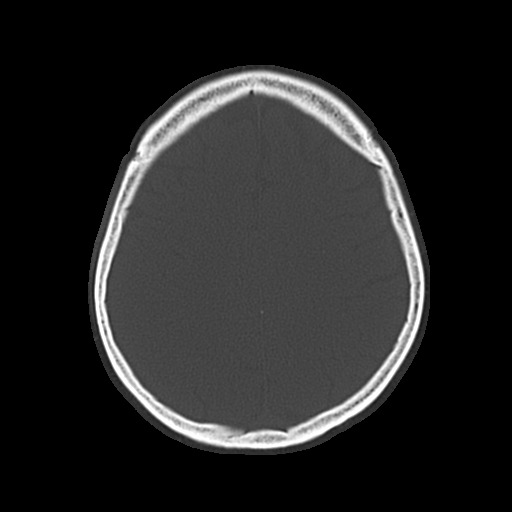
[im 39/50  bone]
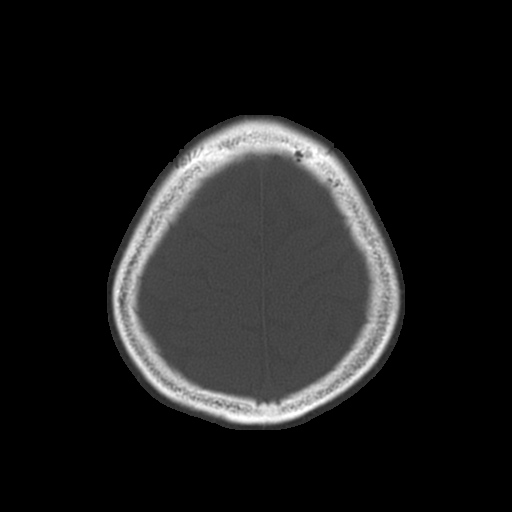

[16 of 30 positions shown; findings below may reference images not displayed]

FINDINGS: There is no evidence of acute infarction, mass lesion, or intra- or
extra-axial hemorrhage on CT.

The posterior fossa, including the cerebellum, brainstem and fourth
ventricle, is within normal limits. The third and lateral
ventricles, and basal ganglia are unremarkable in appearance. The
cerebral hemispheres are symmetric in appearance, with normal
gray-white differentiation. No mass effect or midline shift is seen.

There is no evidence of fracture; visualized osseous structures are
unremarkable in appearance. The orbits are within normal limits. The
paranasal sinuses and mastoid air cells are well-aerated. No
significant soft tissue abnormalities are seen.
IMPRESSION: Unremarkable noncontrast CT of the head.

## 2017-05-22 IMAGING — CT CT ABD-PELV W/ CM
2 of 6 series · 16 of 46 positions shown, 18 images · IV contrast (OMNIPAQUE 300)
Comparison: PET scan 06/22/2010.

CLINICAL DATA: Headache, dizziness, and shoulder pain after a
syncopal episode this weekend and rectal bleed x 2 days. Pt reported
blood clots in multiple bowel movements. Breast cancer.

EXAM:
CT ABDOMEN AND PELVIS WITH CONTRAST
TECHNIQUE: Multidetector CT imaging of the abdomen and pelvis was performed
using the standard protocol following bolus administration of
intravenous contrast.
CONTRAST:  100mL OMNIPAQUE IOHEXOL 300 MG/ML  SOLN

[Series 2: abd/pel with · axial · 0.71mm/px · z∈[-867,-457]mm · 13 of 94 slices shown, 15 images]
[im 6/94  soft-tissue]
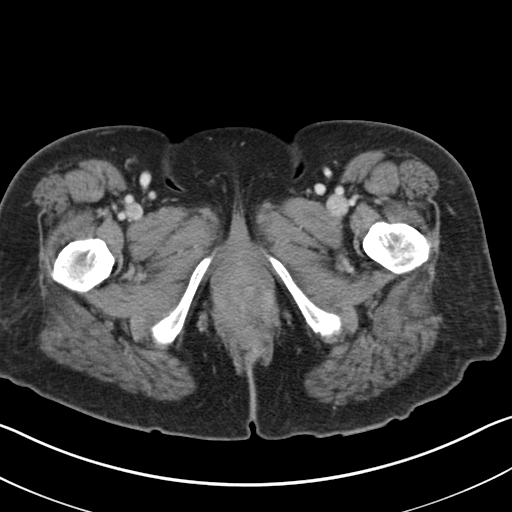
[im 6/94  bone]
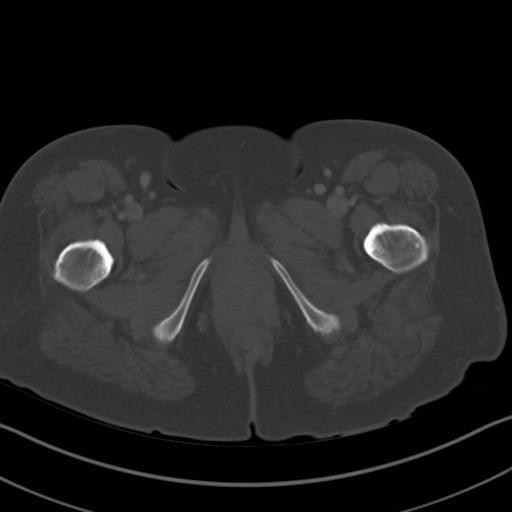
[im 12/94  soft-tissue]
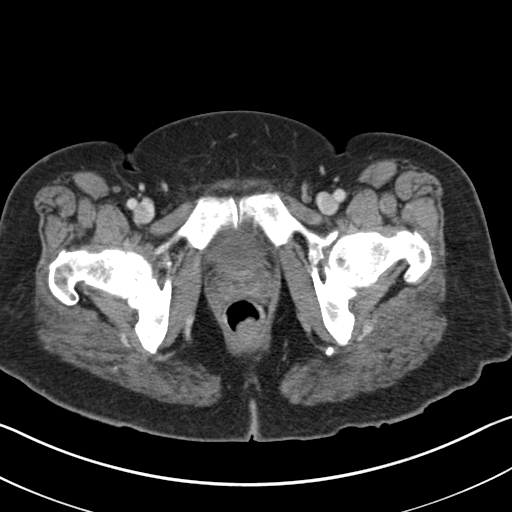
[im 18/94  soft-tissue]
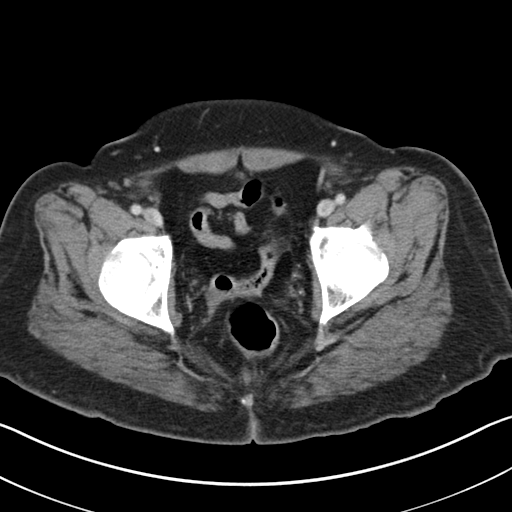
[im 30/94  soft-tissue]
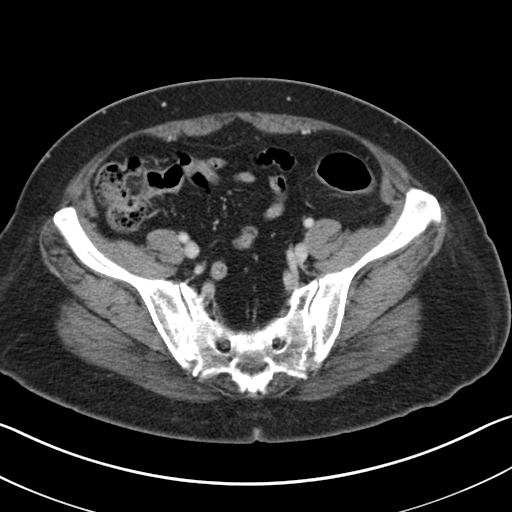
[im 35/94  soft-tissue]
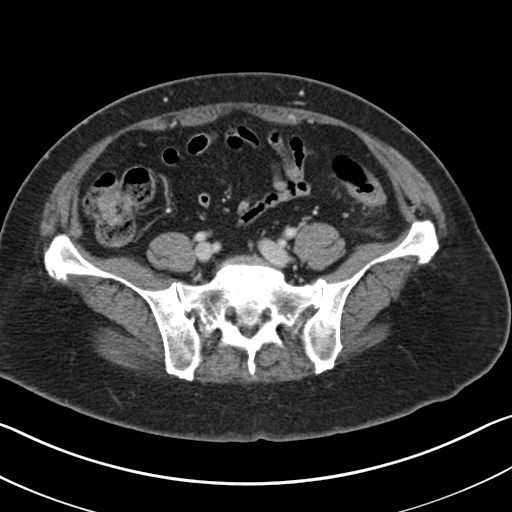
[im 41/94  soft-tissue]
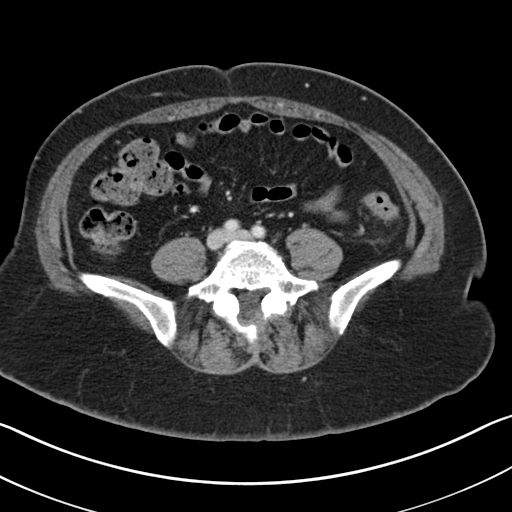
[im 47/94  soft-tissue]
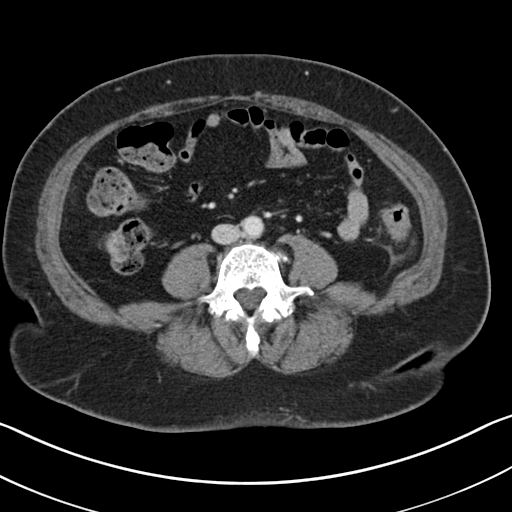
[im 53/94  soft-tissue]
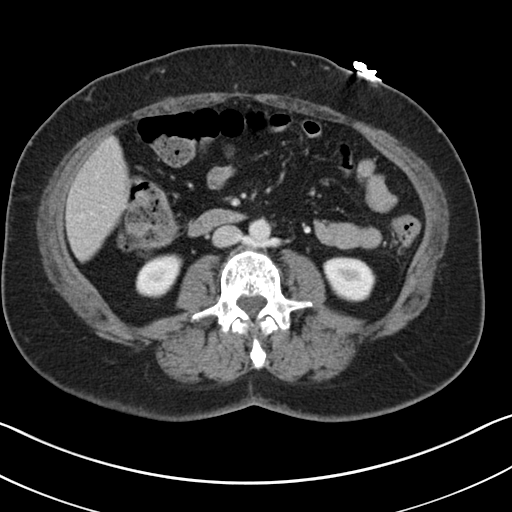
[im 59/94  soft-tissue]
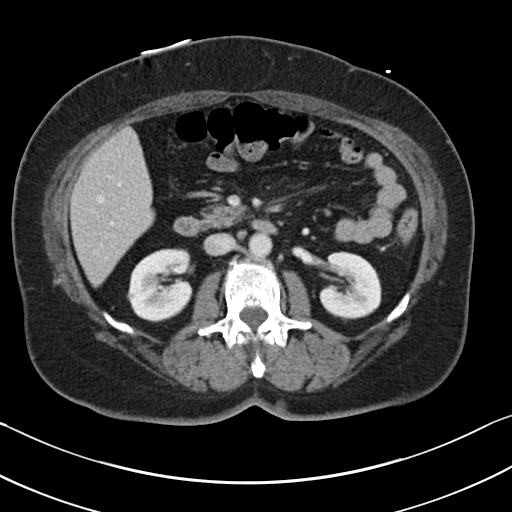
[im 59/94  bone]
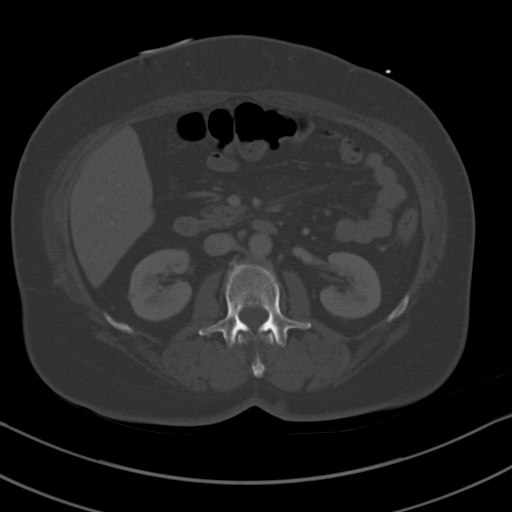
[im 64/94  soft-tissue]
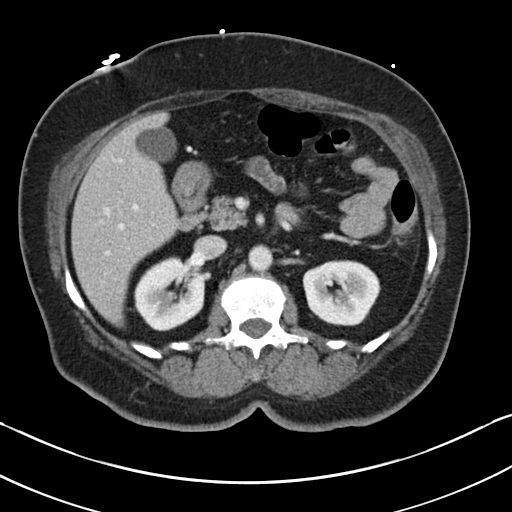
[im 76/94  soft-tissue]
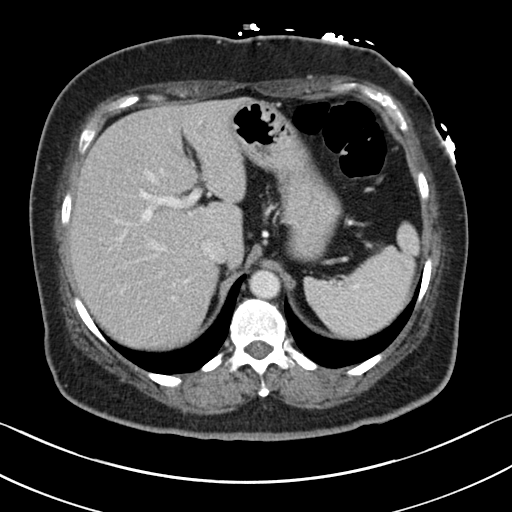
[im 82/94  soft-tissue]
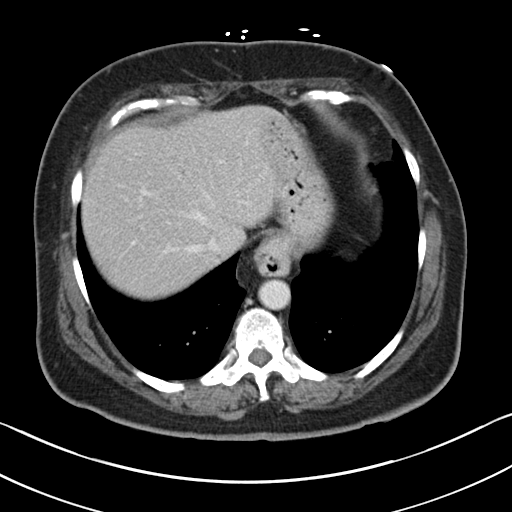
[im 88/94  soft-tissue]
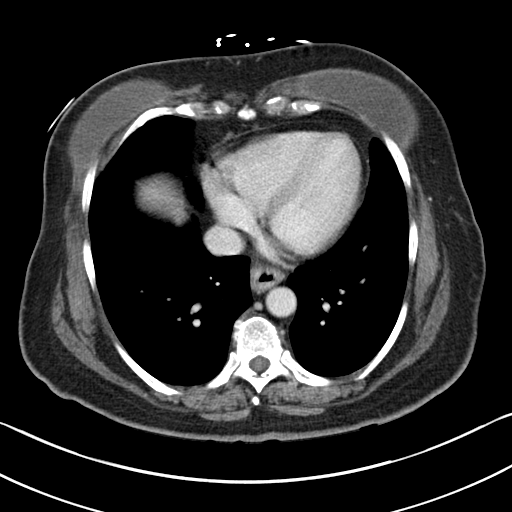

[Series 3: coronal a/|p · coronal · 0.70mm/px · 3 of 100 slices shown]
[im 34/100  soft-tissue]
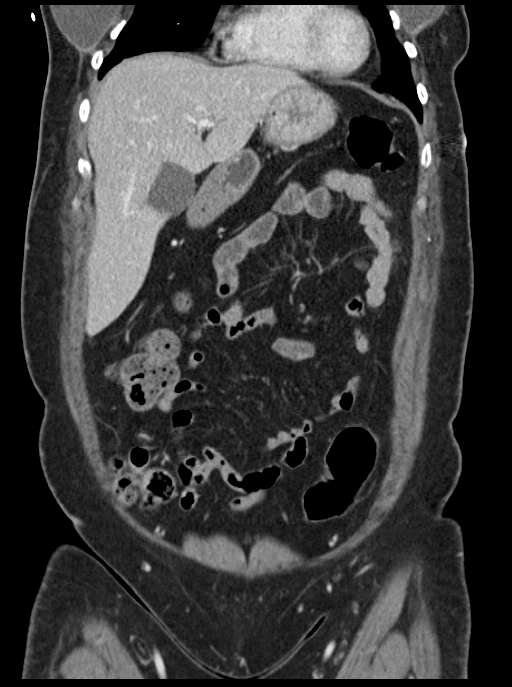
[im 45/100  soft-tissue]
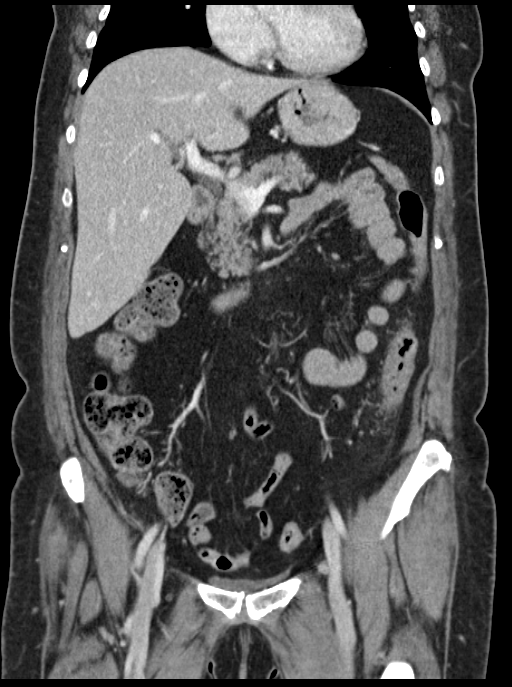
[im 56/100  soft-tissue]
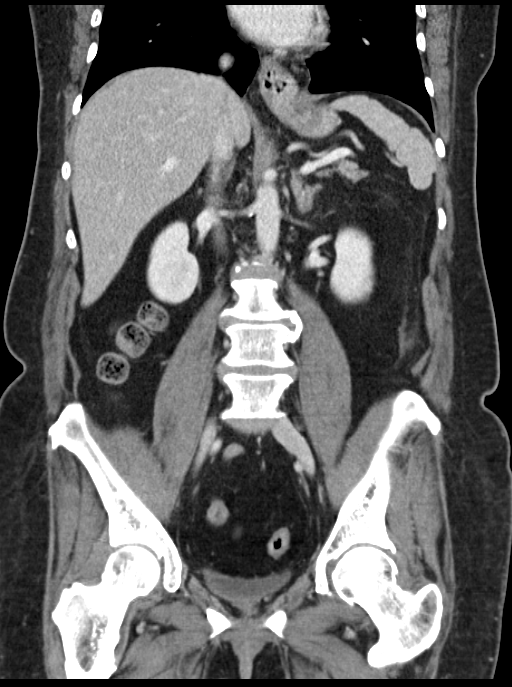

[16 of 46 positions shown; findings below may reference images not displayed]

FINDINGS: BODY WALL: BILATERAL breast implants.

LOWER CHEST: Unremarkable.  Sliding-type hiatal hernia.

ABDOMEN/PELVIS:

Liver: No focal abnormality.

Biliary: No evidence of biliary obstruction or stone.

Pancreas: Unremarkable.

Spleen: Unremarkable.

Adrenals: Unremarkable.

Kidneys and ureters: No hydronephrosis or stone.

Bladder: Unremarkable.

Reproductive: Total abdominal hysterectomy.

Bowel: No obstruction. No appendiceal inflammation. There is bowel
wall thickening involving the descending colon over an 8-9 cm
segment. Slight pericolonic stranding without signs of perforation.
Findings are consistent with a nonspecific infectious or
inflammatory colitis. Neoplasm is less favored given the length of
involvement.

Retroperitoneum: No mass or adenopathy.

Peritoneum: No free fluid or gas.

Vascular: No acute abnormality.

OSSEOUS: No acute abnormalities.
IMPRESSION: Suspected infectious or inflammatory colitis involving the
descending colon. Slight pericolonic stranding of the fat without
frank perforation.

## 2017-05-30 DIAGNOSIS — R7303 Prediabetes: Secondary | ICD-10-CM | POA: Diagnosis not present

## 2017-05-30 DIAGNOSIS — I10 Essential (primary) hypertension: Secondary | ICD-10-CM | POA: Diagnosis not present

## 2017-05-30 DIAGNOSIS — S91339A Puncture wound without foreign body, unspecified foot, initial encounter: Secondary | ICD-10-CM | POA: Diagnosis not present

## 2017-05-30 DIAGNOSIS — R739 Hyperglycemia, unspecified: Secondary | ICD-10-CM | POA: Diagnosis not present

## 2017-06-25 DIAGNOSIS — H26492 Other secondary cataract, left eye: Secondary | ICD-10-CM | POA: Diagnosis not present

## 2017-06-25 DIAGNOSIS — H04123 Dry eye syndrome of bilateral lacrimal glands: Secondary | ICD-10-CM | POA: Diagnosis not present

## 2017-06-25 DIAGNOSIS — H532 Diplopia: Secondary | ICD-10-CM | POA: Diagnosis not present

## 2017-06-25 DIAGNOSIS — H26491 Other secondary cataract, right eye: Secondary | ICD-10-CM | POA: Diagnosis not present

## 2017-07-31 DIAGNOSIS — H26491 Other secondary cataract, right eye: Secondary | ICD-10-CM | POA: Diagnosis not present

## 2017-08-29 DIAGNOSIS — H26492 Other secondary cataract, left eye: Secondary | ICD-10-CM | POA: Diagnosis not present

## 2017-08-29 DIAGNOSIS — H532 Diplopia: Secondary | ICD-10-CM | POA: Diagnosis not present

## 2017-08-29 DIAGNOSIS — H16223 Keratoconjunctivitis sicca, not specified as Sjogren's, bilateral: Secondary | ICD-10-CM | POA: Diagnosis not present

## 2017-08-29 DIAGNOSIS — H04123 Dry eye syndrome of bilateral lacrimal glands: Secondary | ICD-10-CM | POA: Diagnosis not present

## 2017-10-03 ENCOUNTER — Other Ambulatory Visit: Payer: Self-pay

## 2017-12-09 DIAGNOSIS — Z23 Encounter for immunization: Secondary | ICD-10-CM | POA: Diagnosis not present

## 2017-12-09 DIAGNOSIS — H16223 Keratoconjunctivitis sicca, not specified as Sjogren's, bilateral: Secondary | ICD-10-CM | POA: Diagnosis not present

## 2017-12-09 DIAGNOSIS — H04123 Dry eye syndrome of bilateral lacrimal glands: Secondary | ICD-10-CM | POA: Diagnosis not present

## 2018-01-07 DIAGNOSIS — K219 Gastro-esophageal reflux disease without esophagitis: Secondary | ICD-10-CM | POA: Diagnosis not present

## 2018-01-07 DIAGNOSIS — I10 Essential (primary) hypertension: Secondary | ICD-10-CM | POA: Diagnosis not present

## 2018-01-07 DIAGNOSIS — R739 Hyperglycemia, unspecified: Secondary | ICD-10-CM | POA: Diagnosis not present

## 2018-01-07 DIAGNOSIS — E7800 Pure hypercholesterolemia, unspecified: Secondary | ICD-10-CM | POA: Diagnosis not present

## 2018-01-22 ENCOUNTER — Other Ambulatory Visit: Payer: Self-pay

## 2018-07-01 DIAGNOSIS — Z0000 Encounter for general adult medical examination without abnormal findings: Secondary | ICD-10-CM | POA: Diagnosis not present

## 2018-07-01 DIAGNOSIS — R739 Hyperglycemia, unspecified: Secondary | ICD-10-CM | POA: Diagnosis not present

## 2018-07-01 DIAGNOSIS — I10 Essential (primary) hypertension: Secondary | ICD-10-CM | POA: Diagnosis not present

## 2018-07-08 DIAGNOSIS — E7800 Pure hypercholesterolemia, unspecified: Secondary | ICD-10-CM | POA: Diagnosis not present

## 2018-07-08 DIAGNOSIS — R7303 Prediabetes: Secondary | ICD-10-CM | POA: Diagnosis not present

## 2018-07-08 DIAGNOSIS — R739 Hyperglycemia, unspecified: Secondary | ICD-10-CM | POA: Diagnosis not present

## 2018-07-08 DIAGNOSIS — I10 Essential (primary) hypertension: Secondary | ICD-10-CM | POA: Diagnosis not present

## 2018-07-08 DIAGNOSIS — Z0000 Encounter for general adult medical examination without abnormal findings: Secondary | ICD-10-CM | POA: Diagnosis not present

## 2019-05-20 DIAGNOSIS — E7849 Other hyperlipidemia: Secondary | ICD-10-CM | POA: Diagnosis not present

## 2019-05-20 DIAGNOSIS — R7301 Impaired fasting glucose: Secondary | ICD-10-CM | POA: Diagnosis not present

## 2019-05-20 DIAGNOSIS — Z0000 Encounter for general adult medical examination without abnormal findings: Secondary | ICD-10-CM | POA: Diagnosis not present

## 2019-05-20 DIAGNOSIS — I10 Essential (primary) hypertension: Secondary | ICD-10-CM | POA: Diagnosis not present

## 2019-05-20 DIAGNOSIS — E559 Vitamin D deficiency, unspecified: Secondary | ICD-10-CM | POA: Diagnosis not present

## 2019-05-20 DIAGNOSIS — E7841 Elevated Lipoprotein(a): Secondary | ICD-10-CM | POA: Diagnosis not present

## 2019-05-27 DIAGNOSIS — R7303 Prediabetes: Secondary | ICD-10-CM | POA: Diagnosis not present

## 2019-05-27 DIAGNOSIS — K219 Gastro-esophageal reflux disease without esophagitis: Secondary | ICD-10-CM | POA: Diagnosis not present

## 2019-05-27 DIAGNOSIS — I10 Essential (primary) hypertension: Secondary | ICD-10-CM | POA: Diagnosis not present

## 2019-05-27 DIAGNOSIS — K2270 Barrett's esophagus without dysplasia: Secondary | ICD-10-CM | POA: Diagnosis not present

## 2019-05-27 DIAGNOSIS — E785 Hyperlipidemia, unspecified: Secondary | ICD-10-CM | POA: Diagnosis not present

## 2019-10-22 ENCOUNTER — Encounter: Payer: Self-pay | Admitting: Genetic Counselor

## 2019-11-16 DIAGNOSIS — Z23 Encounter for immunization: Secondary | ICD-10-CM | POA: Diagnosis not present

## 2019-11-16 DIAGNOSIS — R112 Nausea with vomiting, unspecified: Secondary | ICD-10-CM | POA: Diagnosis not present

## 2019-11-16 DIAGNOSIS — R109 Unspecified abdominal pain: Secondary | ICD-10-CM | POA: Diagnosis not present

## 2020-04-28 DIAGNOSIS — E785 Hyperlipidemia, unspecified: Secondary | ICD-10-CM | POA: Diagnosis not present

## 2020-04-28 DIAGNOSIS — Z0000 Encounter for general adult medical examination without abnormal findings: Secondary | ICD-10-CM | POA: Diagnosis not present

## 2020-04-28 DIAGNOSIS — E559 Vitamin D deficiency, unspecified: Secondary | ICD-10-CM | POA: Diagnosis not present

## 2020-04-28 DIAGNOSIS — R7303 Prediabetes: Secondary | ICD-10-CM | POA: Diagnosis not present

## 2020-06-02 DIAGNOSIS — Z0000 Encounter for general adult medical examination without abnormal findings: Secondary | ICD-10-CM | POA: Diagnosis not present

## 2020-06-02 DIAGNOSIS — R7303 Prediabetes: Secondary | ICD-10-CM | POA: Diagnosis not present

## 2020-06-02 DIAGNOSIS — I10 Essential (primary) hypertension: Secondary | ICD-10-CM | POA: Diagnosis not present

## 2020-06-02 DIAGNOSIS — Z79899 Other long term (current) drug therapy: Secondary | ICD-10-CM | POA: Diagnosis not present

## 2020-06-02 DIAGNOSIS — E785 Hyperlipidemia, unspecified: Secondary | ICD-10-CM | POA: Diagnosis not present

## 2020-06-02 DIAGNOSIS — H6123 Impacted cerumen, bilateral: Secondary | ICD-10-CM | POA: Diagnosis not present

## 2020-06-02 DIAGNOSIS — J3089 Other allergic rhinitis: Secondary | ICD-10-CM | POA: Diagnosis not present

## 2020-06-02 DIAGNOSIS — K2270 Barrett's esophagus without dysplasia: Secondary | ICD-10-CM | POA: Diagnosis not present

## 2020-08-25 DIAGNOSIS — E785 Hyperlipidemia, unspecified: Secondary | ICD-10-CM | POA: Diagnosis not present

## 2020-08-25 DIAGNOSIS — R7303 Prediabetes: Secondary | ICD-10-CM | POA: Diagnosis not present

## 2020-08-25 DIAGNOSIS — Z79899 Other long term (current) drug therapy: Secondary | ICD-10-CM | POA: Diagnosis not present

## 2020-09-01 DIAGNOSIS — E785 Hyperlipidemia, unspecified: Secondary | ICD-10-CM | POA: Diagnosis not present

## 2020-09-01 DIAGNOSIS — Z8719 Personal history of other diseases of the digestive system: Secondary | ICD-10-CM | POA: Diagnosis not present

## 2020-09-01 DIAGNOSIS — R7303 Prediabetes: Secondary | ICD-10-CM | POA: Diagnosis not present

## 2020-09-01 DIAGNOSIS — I129 Hypertensive chronic kidney disease with stage 1 through stage 4 chronic kidney disease, or unspecified chronic kidney disease: Secondary | ICD-10-CM | POA: Diagnosis not present

## 2021-02-28 DIAGNOSIS — R7303 Prediabetes: Secondary | ICD-10-CM | POA: Diagnosis not present

## 2021-02-28 DIAGNOSIS — I129 Hypertensive chronic kidney disease with stage 1 through stage 4 chronic kidney disease, or unspecified chronic kidney disease: Secondary | ICD-10-CM | POA: Diagnosis not present

## 2021-02-28 DIAGNOSIS — E785 Hyperlipidemia, unspecified: Secondary | ICD-10-CM | POA: Diagnosis not present

## 2021-03-07 DIAGNOSIS — E785 Hyperlipidemia, unspecified: Secondary | ICD-10-CM | POA: Diagnosis not present

## 2021-03-07 DIAGNOSIS — R7303 Prediabetes: Secondary | ICD-10-CM | POA: Diagnosis not present

## 2021-03-07 DIAGNOSIS — R300 Dysuria: Secondary | ICD-10-CM | POA: Diagnosis not present

## 2021-03-07 DIAGNOSIS — K219 Gastro-esophageal reflux disease without esophagitis: Secondary | ICD-10-CM | POA: Diagnosis not present

## 2021-03-07 DIAGNOSIS — I129 Hypertensive chronic kidney disease with stage 1 through stage 4 chronic kidney disease, or unspecified chronic kidney disease: Secondary | ICD-10-CM | POA: Diagnosis not present

## 2021-03-07 DIAGNOSIS — K2270 Barrett's esophagus without dysplasia: Secondary | ICD-10-CM | POA: Diagnosis not present

## 2021-03-08 DIAGNOSIS — R300 Dysuria: Secondary | ICD-10-CM | POA: Diagnosis not present

## 2021-09-06 DIAGNOSIS — I129 Hypertensive chronic kidney disease with stage 1 through stage 4 chronic kidney disease, or unspecified chronic kidney disease: Secondary | ICD-10-CM | POA: Diagnosis not present

## 2021-09-06 DIAGNOSIS — R7303 Prediabetes: Secondary | ICD-10-CM | POA: Diagnosis not present

## 2021-09-06 DIAGNOSIS — E785 Hyperlipidemia, unspecified: Secondary | ICD-10-CM | POA: Diagnosis not present

## 2021-09-12 DIAGNOSIS — E785 Hyperlipidemia, unspecified: Secondary | ICD-10-CM | POA: Diagnosis not present

## 2021-09-12 DIAGNOSIS — N1831 Chronic kidney disease, stage 3a: Secondary | ICD-10-CM | POA: Diagnosis not present

## 2021-09-12 DIAGNOSIS — R7303 Prediabetes: Secondary | ICD-10-CM | POA: Diagnosis not present

## 2021-09-12 DIAGNOSIS — K2270 Barrett's esophagus without dysplasia: Secondary | ICD-10-CM | POA: Diagnosis not present

## 2021-09-12 DIAGNOSIS — Z0000 Encounter for general adult medical examination without abnormal findings: Secondary | ICD-10-CM | POA: Diagnosis not present

## 2021-09-12 DIAGNOSIS — I129 Hypertensive chronic kidney disease with stage 1 through stage 4 chronic kidney disease, or unspecified chronic kidney disease: Secondary | ICD-10-CM | POA: Diagnosis not present

## 2021-09-12 DIAGNOSIS — E559 Vitamin D deficiency, unspecified: Secondary | ICD-10-CM | POA: Diagnosis not present

## 2021-09-12 DIAGNOSIS — H6120 Impacted cerumen, unspecified ear: Secondary | ICD-10-CM | POA: Diagnosis not present

## 2021-09-12 DIAGNOSIS — R946 Abnormal results of thyroid function studies: Secondary | ICD-10-CM | POA: Diagnosis not present

## 2021-09-12 DIAGNOSIS — K219 Gastro-esophageal reflux disease without esophagitis: Secondary | ICD-10-CM | POA: Diagnosis not present

## 2021-09-28 DIAGNOSIS — Z1212 Encounter for screening for malignant neoplasm of rectum: Secondary | ICD-10-CM | POA: Diagnosis not present

## 2021-09-28 DIAGNOSIS — Z1211 Encounter for screening for malignant neoplasm of colon: Secondary | ICD-10-CM | POA: Diagnosis not present

## 2021-11-20 ENCOUNTER — Encounter: Payer: Self-pay | Admitting: Gastroenterology

## 2021-12-19 ENCOUNTER — Ambulatory Visit: Payer: Medicare Other

## 2021-12-19 DIAGNOSIS — Z23 Encounter for immunization: Secondary | ICD-10-CM

## 2022-03-12 DIAGNOSIS — R7303 Prediabetes: Secondary | ICD-10-CM | POA: Diagnosis not present

## 2022-03-12 DIAGNOSIS — I129 Hypertensive chronic kidney disease with stage 1 through stage 4 chronic kidney disease, or unspecified chronic kidney disease: Secondary | ICD-10-CM | POA: Diagnosis not present

## 2022-03-12 DIAGNOSIS — E559 Vitamin D deficiency, unspecified: Secondary | ICD-10-CM | POA: Diagnosis not present

## 2022-03-12 DIAGNOSIS — K219 Gastro-esophageal reflux disease without esophagitis: Secondary | ICD-10-CM | POA: Diagnosis not present

## 2022-03-12 DIAGNOSIS — R946 Abnormal results of thyroid function studies: Secondary | ICD-10-CM | POA: Diagnosis not present

## 2022-03-19 DIAGNOSIS — K219 Gastro-esophageal reflux disease without esophagitis: Secondary | ICD-10-CM | POA: Diagnosis not present

## 2022-03-19 DIAGNOSIS — E559 Vitamin D deficiency, unspecified: Secondary | ICD-10-CM | POA: Diagnosis not present

## 2022-03-19 DIAGNOSIS — Z23 Encounter for immunization: Secondary | ICD-10-CM | POA: Diagnosis not present

## 2022-03-19 DIAGNOSIS — E785 Hyperlipidemia, unspecified: Secondary | ICD-10-CM | POA: Diagnosis not present

## 2022-03-19 DIAGNOSIS — R7303 Prediabetes: Secondary | ICD-10-CM | POA: Diagnosis not present

## 2022-06-18 DIAGNOSIS — R7303 Prediabetes: Secondary | ICD-10-CM | POA: Diagnosis not present

## 2022-06-18 DIAGNOSIS — K219 Gastro-esophageal reflux disease without esophagitis: Secondary | ICD-10-CM | POA: Diagnosis not present

## 2022-06-18 DIAGNOSIS — E785 Hyperlipidemia, unspecified: Secondary | ICD-10-CM | POA: Diagnosis not present

## 2022-06-25 DIAGNOSIS — K219 Gastro-esophageal reflux disease without esophagitis: Secondary | ICD-10-CM | POA: Diagnosis not present

## 2022-06-25 DIAGNOSIS — E1129 Type 2 diabetes mellitus with other diabetic kidney complication: Secondary | ICD-10-CM | POA: Diagnosis not present

## 2022-06-25 DIAGNOSIS — E559 Vitamin D deficiency, unspecified: Secondary | ICD-10-CM | POA: Diagnosis not present

## 2022-06-25 DIAGNOSIS — I129 Hypertensive chronic kidney disease with stage 1 through stage 4 chronic kidney disease, or unspecified chronic kidney disease: Secondary | ICD-10-CM | POA: Diagnosis not present

## 2022-08-27 DIAGNOSIS — R229 Localized swelling, mass and lump, unspecified: Secondary | ICD-10-CM | POA: Diagnosis not present

## 2022-08-27 DIAGNOSIS — I129 Hypertensive chronic kidney disease with stage 1 through stage 4 chronic kidney disease, or unspecified chronic kidney disease: Secondary | ICD-10-CM | POA: Diagnosis not present

## 2022-10-18 DIAGNOSIS — I129 Hypertensive chronic kidney disease with stage 1 through stage 4 chronic kidney disease, or unspecified chronic kidney disease: Secondary | ICD-10-CM | POA: Diagnosis not present

## 2022-10-18 DIAGNOSIS — E559 Vitamin D deficiency, unspecified: Secondary | ICD-10-CM | POA: Diagnosis not present

## 2022-10-18 DIAGNOSIS — E1129 Type 2 diabetes mellitus with other diabetic kidney complication: Secondary | ICD-10-CM | POA: Diagnosis not present

## 2022-10-25 DIAGNOSIS — E1129 Type 2 diabetes mellitus with other diabetic kidney complication: Secondary | ICD-10-CM | POA: Diagnosis not present

## 2022-10-25 DIAGNOSIS — M7989 Other specified soft tissue disorders: Secondary | ICD-10-CM | POA: Diagnosis not present

## 2022-10-25 DIAGNOSIS — I129 Hypertensive chronic kidney disease with stage 1 through stage 4 chronic kidney disease, or unspecified chronic kidney disease: Secondary | ICD-10-CM | POA: Diagnosis not present

## 2022-11-07 DIAGNOSIS — I10 Essential (primary) hypertension: Secondary | ICD-10-CM | POA: Diagnosis not present

## 2022-11-28 ENCOUNTER — Telehealth: Payer: Self-pay

## 2022-11-28 ENCOUNTER — Other Ambulatory Visit: Payer: Self-pay | Admitting: Family Medicine

## 2022-11-28 DIAGNOSIS — R413 Other amnesia: Secondary | ICD-10-CM | POA: Diagnosis not present

## 2022-11-28 DIAGNOSIS — Z79899 Other long term (current) drug therapy: Secondary | ICD-10-CM | POA: Diagnosis not present

## 2022-11-28 DIAGNOSIS — R002 Palpitations: Secondary | ICD-10-CM | POA: Diagnosis not present

## 2022-11-28 DIAGNOSIS — R519 Headache, unspecified: Secondary | ICD-10-CM

## 2022-11-28 DIAGNOSIS — I452 Bifascicular block: Secondary | ICD-10-CM | POA: Diagnosis not present

## 2022-11-28 DIAGNOSIS — R5383 Other fatigue: Secondary | ICD-10-CM

## 2022-11-28 DIAGNOSIS — I10 Essential (primary) hypertension: Secondary | ICD-10-CM | POA: Diagnosis not present

## 2022-11-28 DIAGNOSIS — I4510 Unspecified right bundle-branch block: Secondary | ICD-10-CM | POA: Diagnosis not present

## 2022-11-28 DIAGNOSIS — R0789 Other chest pain: Secondary | ICD-10-CM | POA: Diagnosis not present

## 2022-11-28 NOTE — Patient Outreach (Signed)
Care Coordination   In Person Provider Office Visit Note   11/28/2022 Name: [name] MRN: [id number] DOB: [birth year]  [name] is a 72 y.o. year old female who sees Pearson Grippe, MD for primary care. I engaged with [name] in the providers office today.  What matters to the patients health and wellness today?  HTN Management    Goals Addressed             This Visit's Progress    COMPLETED: Care Coordination Activities-No follow up required       Care Coordination Interventions: Advised patient to Annual Wellness exam. Discussed [facility] services and support. Assessed SDOH. Advised to discuss with primary care physician if services needed in the future.        SDOH assessments and interventions completed:  Yes  SDOH Interventions Today    Flowsheet Row Most Recent Value  SDOH Interventions   Food Insecurity Interventions Intervention Not Indicated  Transportation Interventions Intervention Not Indicated        Care Coordination Interventions:  Yes, provided   Follow up plan: No further intervention required.   Encounter Outcome:  Patient Visit Completed   Bary Leriche, RN, MSN [facility]  [facility], [facility] Management Community Coordinator Direct Dial: [phone]  Fax: [phone] Website: [email or web address]

## 2022-11-28 NOTE — Patient Instructions (Signed)
Visit Information  Thank you for taking time to visit with me today. Please don't hesitate to contact me if I can be of assistance to you.   Following are the goals we discussed today:   Goals Addressed             This Visit's Progress    COMPLETED: Care Coordination Activities-No follow up required       Care Coordination Interventions: Advised patient to Annual Wellness exam. Discussed [facility] services and support. Assessed SDOH. Advised to discuss with primary care physician if services needed in the future.          If you are experiencing a Mental Health or Behavioral Health Crisis or need someone to talk to, please call the Suicide and Crisis Lifeline: 988   The patient verbalized understanding of instructions, educational materials, and care plan provided today and DECLINED offer to receive copy of patient instructions, educational materials, and care plan.   The patient has been provided with contact information for the care management team and has been advised to call with any health related questions or concerns.   Bary Leriche, RN, MSN [facility], [facility] Management Community Coordinator Direct Dial: [phone]  Fax: [phone] Website: [email or web address]

## 2022-12-03 DIAGNOSIS — E876 Hypokalemia: Secondary | ICD-10-CM | POA: Diagnosis not present

## 2022-12-28 ENCOUNTER — Ambulatory Visit
Admission: RE | Admit: 2022-12-28 | Discharge: 2022-12-28 | Disposition: A | Discharge status: Home / Self Care | Payer: Medicare Other | Source: Ambulatory Visit | Attending: Family Medicine | Admitting: Family Medicine

## 2022-12-28 DIAGNOSIS — I6782 Cerebral ischemia: Secondary | ICD-10-CM | POA: Diagnosis not present

## 2022-12-28 DIAGNOSIS — R5383 Other fatigue: Secondary | ICD-10-CM

## 2022-12-28 DIAGNOSIS — R519 Headache, unspecified: Secondary | ICD-10-CM | POA: Diagnosis not present

## 2022-12-28 DIAGNOSIS — R200 Anesthesia of skin: Secondary | ICD-10-CM | POA: Diagnosis not present

## 2022-12-28 DIAGNOSIS — R413 Other amnesia: Secondary | ICD-10-CM

## 2022-12-28 MED ORDER — GADOPICLENOL 0.5 MMOL/ML IV SOLN
7.5000 mL | Freq: Once | INTRAVENOUS | Status: AC | PRN
  Administered 2022-12-28: 7.5 mL via INTRAVENOUS

## 2023-01-21 DIAGNOSIS — E1129 Type 2 diabetes mellitus with other diabetic kidney complication: Secondary | ICD-10-CM | POA: Diagnosis not present

## 2023-01-21 DIAGNOSIS — Z79899 Other long term (current) drug therapy: Secondary | ICD-10-CM | POA: Diagnosis not present

## 2023-01-21 DIAGNOSIS — E785 Hyperlipidemia, unspecified: Secondary | ICD-10-CM | POA: Diagnosis not present

## 2023-01-21 DIAGNOSIS — K219 Gastro-esophageal reflux disease without esophagitis: Secondary | ICD-10-CM | POA: Diagnosis not present

## 2023-01-21 DIAGNOSIS — I10 Essential (primary) hypertension: Secondary | ICD-10-CM | POA: Diagnosis not present

## 2023-01-27 DIAGNOSIS — I4510 Unspecified right bundle-branch block: Secondary | ICD-10-CM | POA: Insufficient documentation

## 2023-01-27 DIAGNOSIS — I10 Essential (primary) hypertension: Secondary | ICD-10-CM | POA: Insufficient documentation

## 2023-01-27 DIAGNOSIS — R072 Precordial pain: Secondary | ICD-10-CM | POA: Insufficient documentation

## 2023-01-27 NOTE — Progress Notes (Unsigned)
Cardiology Office Note   Date:  01/28/2023   ID:  [name], DOB 1950-05-05, MRN [id number]  PCP:  Irena Reichmann, DO  Cardiologist:   None Referring:  Pearson Grippe, MD  Chief Complaint  Patient presents with   Medication Reaction      History of Present Illness: [name] is a 72 y.o. female who presents who I saw hi 2017 for evaluation of syncope. She has no past cardiac history. There was no clear etiology.  She did have RBBB.  She presents with hypertension.  She said that in June she had an episode of her jaw swelling.  She went to the dentist and there was nothing dental going on.  It was decided that she was having angioedema.  She has seen her primary physician and has been taken off of lisinopril.  For blood pressure management she has been tried on multiple medications and I did review these records.  She has been on amlodipine and spironolactone.  I do not see other meds that were listed and she is currently taking chlorthalidone and low-dose hydralazine.  She says she is just been feeling poorly with all of this going on although she seems to be tolerating the current meds as listed.  She has had some fatigue.  She is having rare palpitations but this is infrequent.  She stops what she is doing and it slows down fairly quickly.  She would get intermittent chest discomfort but she is able to push a lawnmower and do her chores.  She is not having any resting chest pressure, neck or arm discomfort.  She is not having any accelerated pattern.  She has had no new shortness of breath, PND or orthopnea.   Past Medical History:  Diagnosis Date   Allergy    SEASONAL   Arthritis    THUMB   Asthma    Barrett's esophagus    Breast cancer ([facility])    both breasts   Endometrioid adenocarcinoma    Fundic gland polyps of stomach, benign    GERD (gastroesophageal reflux disease)    Hiatal hernia    Hyperlipidemia    Hypertension     Past Surgical History:   Procedure Laterality Date   BREAST RECONSTRUCTION     COLONOSCOPY     ENDOMETRIAL BIOPSY     EYE SURGERY     MASTECTOMY     TOTAL ABDOMINAL HYSTERECTOMY     UPPER GASTROINTESTINAL ENDOSCOPY       Current Outpatient Medications  Medication Sig Dispense Refill   acetaminophen (TYLENOL) 500 MG tablet Take 1,000 mg by mouth every 6 (six) hours as needed for moderate pain or headache.     aspirin 325 MG tablet Take 325 mg by mouth once.     chlorthalidone (HYGROTON) 25 MG tablet Take 25 mg by mouth every morning.     ezetimibe (ZETIA) 10 MG tablet Take 10 mg by mouth daily.     hydrALAZINE (APRESOLINE) 25 MG tablet Take 25 mg by mouth 3 (three) times daily.     KLOR-CON M20 20 MEQ tablet Take 20 mEq by mouth 2 (two) times daily.     lisinopril-hydrochlorothiazide (PRINZIDE,ZESTORETIC) 20-12.5 MG per tablet Take 0.5 tablets by mouth daily.      naproxen (NAPROSYN) 375 MG tablet Take 1 tablet (375 mg total) by mouth 2 (two) times daily. 14 tablet 0   pantoprazole (PROTONIX) 40 MG tablet Take 40 mg by mouth daily.  No current facility-administered medications for this visit.    Allergies:   Citrus, Lactose intolerance (gi), Lipitor [atorvastatin], Lisinopril, Norvasc [amlodipine], Penicillins, and Spironolactone    Social History:  The patient  reports that she has never smoked. She has never used smokeless tobacco. She reports current alcohol use. She reports that she does not use drugs.   Family History:  The patient's family history includes Brain cancer in her maternal uncle; Breast cancer in her maternal aunt and mother; Irritable bowel syndrome in her maternal grandfather; Stroke in her maternal grandmother.    ROS:  Please see the history of present illness.   Otherwise, review of systems are positive for none.   All other systems are reviewed and negative.    PHYSICAL EXAM: VS:  BP (!) 144/76 (BP Location: Left Arm, Patient Position: Sitting, Cuff Size: Normal)   Pulse 88    Ht 5\' 4"  (1.626 m)   Wt 160 lb (72.6 kg)   SpO2 95%   BMI 27.46 kg/m  , BMI Body mass index is 27.46 kg/m. GENERAL:  Well appearing HEENT:  Pupils equal round and reactive, fundi not visualized, oral mucosa unremarkable NECK:  No jugular venous distention, waveform within normal limits, carotid upstroke brisk and symmetric, no bruits, no thyromegaly LYMPHATICS:  No cervical, inguinal adenopathy LUNGS:  Clear to auscultation bilaterally BACK:  No CVA tenderness CHEST:  Unremarkable HEART:  PMI not displaced or sustained,S1 and S2 within normal limits, no S3, no S4, no clicks, no rubs, no murmurs ABD:  Flat, positive bowel sounds normal in frequency in pitch, no bruits, no rebound, no guarding, no midline pulsatile mass, no hepatomegaly, no splenomegaly EXT:  2 plus pulses throughout, no edema, no cyanosis no clubbing SKIN:  No rashes no nodules NEURO:  Cranial nerves II through XII grossly intact, motor grossly intact throughout [facility]:  Cognitively intact, oriented to person place and time    EKG:  EKG Interpretation Date/Time:  Monday January 28 2023 11:01:39 EST Ventricular Rate:  87 PR Interval:  156 QRS Duration:  130 QT Interval:  392 QTC Calculation: 471 R Axis:   -17  Text Interpretation: Normal sinus rhythm Right bundle branch block When compared with ECG of 08-Nov-2016 18:26, QRS has widend Confirmed by Rollene Rotunda ([id number]) on 01/28/2023 11:29:48 AM     Recent Labs: No results found for requested labs within last 365 days.    Lipid Panel No results found for: "CHOL", "TRIG", "HDL", "CHOLHDL", "VLDL", "LDLCALC", "LDLDIRECT"    Wt Readings from Last 3 Encounters:  01/28/23 160 lb (72.6 kg)  04/25/15 174 lb (78.9 kg)  12/28/11 177 lb (80.3 kg)      Other studies Reviewed: Additional studies/ records that were reviewed today include: Primary care office records. Review of the above records demonstrates:  Please see elsewhere in the note.   NA   ASSESSMENT AND PLAN:  RBBB: This has been chronic.  No change in therapy  HTN: I think this has been very reasonable adjustment of medications as I reviewed it.  She currently seems to be tolerating the meds that she is on.  She is going to keep a 10-week blood pressure diary and I will make adjustments or we can do this to her hypertension clinic.  She could have a dose adjustment of her hydralazine as needed.  Dyslipidemia: She has an LDL that was recently checked and it is 150.  However, I think because of her sensitivities to medicines I would suggest trying  to avoid a PCSK9 inhibitor.  We talked about a plant based diet and she is going to try this first but will further discuss this with Irena Reichmann, DO  Current medicines are reviewed at length with the patient today.  The patient does not have concerns regarding medicines.  The following changes have been made:  no change  Labs/ tests ordered today include:   Orders Placed This Encounter  Procedures   AMB Referral to [facility] Pharm-D   EKG 12-Lead     Disposition:   FU with with Hypertension Clinic in two months.     Signed, Rollene Rotunda, MD Case 01/28/2023 11:53 AM    [facility]  '

## 2023-01-28 ENCOUNTER — Encounter: Payer: Self-pay | Admitting: Cardiology

## 2023-01-28 ENCOUNTER — Ambulatory Visit: Payer: Medicare Other | Attending: Cardiology | Admitting: Cardiology

## 2023-01-28 VITALS — BP 144/76 | HR 88 | Ht 64.0 in | Wt 160.0 lb

## 2023-01-28 DIAGNOSIS — R072 Precordial pain: Secondary | ICD-10-CM

## 2023-01-28 DIAGNOSIS — E785 Hyperlipidemia, unspecified: Secondary | ICD-10-CM | POA: Diagnosis not present

## 2023-01-28 DIAGNOSIS — I4510 Unspecified right bundle-branch block: Secondary | ICD-10-CM | POA: Diagnosis not present

## 2023-01-28 DIAGNOSIS — I10 Essential (primary) hypertension: Secondary | ICD-10-CM | POA: Diagnosis not present

## 2023-01-28 DIAGNOSIS — Z0000 Encounter for general adult medical examination without abnormal findings: Secondary | ICD-10-CM | POA: Diagnosis not present

## 2023-01-28 DIAGNOSIS — K219 Gastro-esophageal reflux disease without esophagitis: Secondary | ICD-10-CM | POA: Diagnosis not present

## 2023-01-28 DIAGNOSIS — E1129 Type 2 diabetes mellitus with other diabetic kidney complication: Secondary | ICD-10-CM | POA: Diagnosis not present

## 2023-01-28 DIAGNOSIS — I129 Hypertensive chronic kidney disease with stage 1 through stage 4 chronic kidney disease, or unspecified chronic kidney disease: Secondary | ICD-10-CM | POA: Diagnosis not present

## 2023-01-28 DIAGNOSIS — Z8719 Personal history of other diseases of the digestive system: Secondary | ICD-10-CM | POA: Diagnosis not present

## 2023-01-28 DIAGNOSIS — N1831 Chronic kidney disease, stage 3a: Secondary | ICD-10-CM | POA: Diagnosis not present

## 2023-01-28 NOTE — Patient Instructions (Addendum)
Medication Instructions:  No changes.  *If you need a refill on your cardiac medications before your next appointment, please call your pharmacy*    Follow-Up: At [facility], you and your health needs are our priority.  As part of our continuing mission to provide you with exceptional heart care, we have created designated Provider Care Teams.  These Care Teams include your primary Cardiologist (physician) and Advanced Practice Providers (APPs -  Physician Assistants and Nurse Practitioners) who all work together to provide you with the care you need, when you need it.  We recommend signing up for the patient portal called "MyChart".  Sign up information is provided on this After Visit Summary.  MyChart is used to connect with patients for Virtual Visits (Telemedicine).  Patients are able to view lab/test results, encounter notes, upcoming appointments, etc.  Non-urgent messages can be sent to your provider as well.   To learn more about what you can do with MyChart, go to [email or web address].    Your next appointment:     Other Instructions Take and record blood pressure 3x day for 10 days on log provided by office nurse. Please drop off form to office for MD review.   Keep follow up Pharm D appointment for hypertension in 2 months.

## 2023-04-01 ENCOUNTER — Ambulatory Visit: Payer: Medicare Other | Attending: Cardiology | Admitting: Pharmacist Clinician (PhC)/ Clinical Pharmacy Specialist

## 2023-04-01 ENCOUNTER — Encounter: Payer: Self-pay | Admitting: Pharmacist Clinician (PhC)/ Clinical Pharmacy Specialist

## 2023-04-01 VITALS — BP 149/85 | HR 76

## 2023-04-01 DIAGNOSIS — I10 Essential (primary) hypertension: Secondary | ICD-10-CM

## 2023-04-01 NOTE — Assessment & Plan Note (Signed)
Goal BP <130/80 Currently not controlled at 149/85 Recommend starting spironolactone at 12.5 mg daily. Unsure why it is on her allergy list, so will monitor for side effects Patient unsure whether we or her PCP will be managing her BP, decided to consult with her PCP

## 2023-04-01 NOTE — Patient Instructions (Signed)
Take your BP meds as follows:  We would recommend starting spironolactone 12.5 mg (1/2 of 25 mg tab) once daily, with repeat potassium labs after 10-14 days.  (We have no record of why spironolactone was stopped.  This would allow for her to decrease some of the potassium supplement).   If you wish for cardiology to continue following with your blood pressure, please reach out and let us know.  Otherwise we will assume that Dr. Thomasena Edis is working with you.    Check your blood pressure at home daily (if able) and keep record of the readings.  Your blood pressure goal is < 130/80  To check your pressure at home you will need to:  1. Sit up in a chair, with feet flat on the floor and back supported. Do not cross your ankles or legs. 2. Rest your left arm so that the cuff is about heart level. If the cuff goes on your upper arm,  then just relax the arm on the table, arm of the chair or your lap. If you have a wrist cuff, we  suggest relaxing your wrist against your chest (think of it as Pledging the Flag with the  wrong arm).  3. Place the cuff snugly around your arm, about 1 inch above the crook of your elbow. The  cords should be inside the groove of your elbow.  4. Sit quietly, with the cuff in place, for about 5 minutes. After that 5 minutes press the power  button to start a reading. 5. Do not talk or move while the reading is taking place.  6. Record your readings on a sheet of paper. Although most cuffs have a memory, it is often  easier to see a pattern developing when the numbers are all in front of you.  7. You can repeat the reading after 1-3 minutes if it is recommended  Make sure your bladder is empty and you have not had caffeine or tobacco within the last 30 min  Always bring your blood pressure log with you to your appointments. If you have not brought your monitor in to be double checked for accuracy, please bring it to your next appointment.  You can find a list of quality  blood pressure cuffs at [email or web address]  Important lifestyle changes to control high blood pressure  Intervention  Effect on the BP  Lose extra pounds and watch your waistline Weight loss is one of the most effective lifestyle changes for controlling blood pressure. If you're overweight or obese, losing even a small amount of weight can help reduce blood pressure. Blood pressure might go down by about 1 millimeter of mercury (mm Hg) with each kilogram (about 2.2 pounds) of weight lost.  Exercise regularly As a general goal, aim for at least 30 minutes of moderate physical activity every day. Regular physical activity can lower high blood pressure by about 5 to 8 mm Hg.  Eat a healthy diet Eating a diet rich in whole grains, fruits, vegetables, and low-fat dairy products and low in saturated fat and cholesterol. A healthy diet can lower high blood pressure by up to 11 mm Hg.  Reduce salt (sodium) in your diet Even a small reduction of sodium in the diet can improve heart health and reduce high blood pressure by about 5 to 6 mm Hg.  Limit alcohol One drink equals 12 ounces of beer, 5 ounces of wine, or 1.5 ounces of 80-proof liquor.  Limiting alcohol to less than one  drink a day for women or two drinks a day for men can help lower blood pressure by about 4 mm Hg.   If you have any questions or concerns please use My Chart to send questions or call the office at [phone]

## 2023-04-01 NOTE — Progress Notes (Unsigned)
Office Visit    Patient Name: [name] Date of Encounter: 04/01/2023  Primary Care Provider:  Irena Reichmann, DO Primary Cardiologist:  Rollene Rotunda MD  Chief Complaint    Hypertension  Significant Past Medical History   RBBB No current rate limiting medications    Allergies  Allergen Reactions   Citrus     burning   Lactose Intolerance (Gi)     Upset stomach   Lipitor [Atorvastatin]     unknown   Lisinopril    Norvasc [Amlodipine]    Penicillins     Unsure of reaction Has patient had a PCN reaction causing immediate rash, facial/tongue/throat swelling, SOB or lightheadedness with hypotension: unknown Has patient had a PCN reaction causing severe rash involving mucus membranes or skin necrosis: unknown Has patient had a PCN reaction that required hospitalization  Has the patient had a PCN reaction occurring within the last 10 years: unknown If all of the above answers are "NO", then may proceed with Cephalosporin use.    Spironolactone     History of Present Illness    HPI: 73 yo f who is here for her hypertension. At her last visit 01/28/2023, her BP was 144/76, HR 88. She had been on lisinopril/HCTZ until she developed angioedema and was taken off. She has also been on amlodipine and spironolactone in the past. She stated the amlodipine made her blood pressure increase, but cannot recall why the spironolactone was discontinued. Her home readings averaged at 125/69 (104-171/54-99). This visit, her blood pressure was 149/85. Recently, she was told to increase the hydralazine to 100 mg BID from 25 mg BID, but felt like her heart was fluttering, so she started taking 50 mg BID instead.  PMH: HTN, RBBB Family History Father:  Mother: HTN Siblings:  Thinks her brother has HTN Children:  No Social History: Tobacco: no Alcohol: just on special occasions/holidays Caffeine:  Tea and sodas, twice a day Diet:  [place] out once a day, little bit of everything,  if husband picks, its fast food, if she picks it's a sit down  Exercise:  Used to exercise 5 miles 2x weekly, not currently doing it  Accessory Clinical Findings    Lab Results  Component Value Date   CREATININE 0.95 11/08/2016   BUN 15 11/08/2016   NA 138 11/08/2016   K 4.3 11/08/2016   CL 98 (L) 11/08/2016   CO2 28 11/08/2016   Lab Results  Component Value Date   ALT 44 04/11/2015   AST 30 04/11/2015   ALKPHOS 97 04/11/2015   BILITOT 0.5 04/11/2015   No results found for: "HGBA1C"  Home Medications    Current Outpatient Medications  Medication Sig Dispense Refill   chlorthalidone (HYGROTON) 25 MG tablet Take 25 mg by mouth every morning.     ezetimibe (ZETIA) 10 MG tablet Take 10 mg by mouth daily.     hydrALAZINE (APRESOLINE) 25 MG tablet Take 50 mg by mouth 2 (two) times daily.     KLOR-CON M20 20 MEQ tablet Take 20 mEq by mouth 2 (two) times daily.     pantoprazole (PROTONIX) 40 MG tablet Take 40 mg by mouth daily.     No current facility-administered medications for this visit.     HYPERTENSION CONTROL Vitals:   04/01/23 1544 04/01/23 1550  BP: (!) 152/86 (!) 149/85    The patient's blood pressure is elevated above target today. {Click here if intervention needs to be changed Refresh Note :1}  In  order to address the patient's elevated BP: Blood pressure will be monitored at home to determine if medication changes need to be made.; Follow up with primary care provider for management.      Assessment & Plan    Essential hypertension Goal BP <130/80 Currently not controlled at 149/85 Recommend starting spironolactone at 12.5 mg daily. Unsure why it is on her allergy list, so will monitor for side effects Patient unsure whether we or her PCP will be managing her BP, decided to consult with her PCP  Caryl Bis PharmD candidate [facility], class of 2025  I was with student and patient for entire appointment and agree with  above assessment and plan.   Phillips Hay PharmD CPP [facility]  [address] [place], [place] [address] [phone]

## 2023-05-16 DIAGNOSIS — E785 Hyperlipidemia, unspecified: Secondary | ICD-10-CM | POA: Diagnosis not present

## 2023-05-16 DIAGNOSIS — I129 Hypertensive chronic kidney disease with stage 1 through stage 4 chronic kidney disease, or unspecified chronic kidney disease: Secondary | ICD-10-CM | POA: Diagnosis not present

## 2023-05-16 DIAGNOSIS — E1129 Type 2 diabetes mellitus with other diabetic kidney complication: Secondary | ICD-10-CM | POA: Diagnosis not present

## 2023-05-23 DIAGNOSIS — K219 Gastro-esophageal reflux disease without esophagitis: Secondary | ICD-10-CM | POA: Diagnosis not present

## 2023-05-23 DIAGNOSIS — E785 Hyperlipidemia, unspecified: Secondary | ICD-10-CM | POA: Diagnosis not present

## 2023-05-23 DIAGNOSIS — E1129 Type 2 diabetes mellitus with other diabetic kidney complication: Secondary | ICD-10-CM | POA: Diagnosis not present

## 2023-05-23 DIAGNOSIS — I129 Hypertensive chronic kidney disease with stage 1 through stage 4 chronic kidney disease, or unspecified chronic kidney disease: Secondary | ICD-10-CM | POA: Diagnosis not present

## 2023-08-12 DIAGNOSIS — K080 Exfoliation of teeth due to systemic causes: Secondary | ICD-10-CM | POA: Diagnosis not present

## 2023-08-28 DIAGNOSIS — K080 Exfoliation of teeth due to systemic causes: Secondary | ICD-10-CM | POA: Diagnosis not present

## 2023-10-02 DIAGNOSIS — K080 Exfoliation of teeth due to systemic causes: Secondary | ICD-10-CM | POA: Diagnosis not present

## 2023-11-25 DIAGNOSIS — I129 Hypertensive chronic kidney disease with stage 1 through stage 4 chronic kidney disease, or unspecified chronic kidney disease: Secondary | ICD-10-CM | POA: Diagnosis not present

## 2023-11-25 DIAGNOSIS — E1129 Type 2 diabetes mellitus with other diabetic kidney complication: Secondary | ICD-10-CM | POA: Diagnosis not present

## 2023-11-25 DIAGNOSIS — E785 Hyperlipidemia, unspecified: Secondary | ICD-10-CM | POA: Diagnosis not present

## 2023-11-25 DIAGNOSIS — K219 Gastro-esophageal reflux disease without esophagitis: Secondary | ICD-10-CM | POA: Diagnosis not present

## 2023-12-02 DIAGNOSIS — I129 Hypertensive chronic kidney disease with stage 1 through stage 4 chronic kidney disease, or unspecified chronic kidney disease: Secondary | ICD-10-CM | POA: Diagnosis not present

## 2023-12-02 DIAGNOSIS — Z0000 Encounter for general adult medical examination without abnormal findings: Secondary | ICD-10-CM | POA: Diagnosis not present

## 2023-12-02 DIAGNOSIS — N1831 Chronic kidney disease, stage 3a: Secondary | ICD-10-CM | POA: Diagnosis not present

## 2023-12-02 DIAGNOSIS — E1129 Type 2 diabetes mellitus with other diabetic kidney complication: Secondary | ICD-10-CM | POA: Diagnosis not present

## 2023-12-02 DIAGNOSIS — K219 Gastro-esophageal reflux disease without esophagitis: Secondary | ICD-10-CM | POA: Diagnosis not present

## 2023-12-02 DIAGNOSIS — Z23 Encounter for immunization: Secondary | ICD-10-CM | POA: Diagnosis not present

## 2024-01-06 DIAGNOSIS — I10 Essential (primary) hypertension: Secondary | ICD-10-CM | POA: Diagnosis not present
